# Patient Record
Sex: Female | Born: 1990 | Race: Black or African American | Hispanic: No | Marital: Single | Smoking: Never smoker
Health system: Southern US, Community
[De-identification: ages and names within clinical notes are randomized; demographics above are authoritative.]

## PROBLEM LIST (undated history)

## (undated) HISTORY — PX: LEG SURGERY: SHX1003

---

## 2010-08-25 ENCOUNTER — Emergency Department (HOSPITAL_BASED_OUTPATIENT_CLINIC_OR_DEPARTMENT_OTHER)
Admission: EM | Admit: 2010-08-25 | Discharge: 2010-08-25 | Disposition: A | Discharge status: Home / Self Care | Payer: No Typology Code available for payment source | Attending: Emergency Medicine | Admitting: Emergency Medicine

## 2010-08-25 ENCOUNTER — Emergency Department (INDEPENDENT_AMBULATORY_CARE_PROVIDER_SITE_OTHER): Payer: No Typology Code available for payment source

## 2010-08-25 DIAGNOSIS — R071 Chest pain on breathing: Secondary | ICD-10-CM | POA: Insufficient documentation

## 2010-08-25 DIAGNOSIS — M549 Dorsalgia, unspecified: Secondary | ICD-10-CM | POA: Insufficient documentation

## 2010-08-25 DIAGNOSIS — Y92410 Unspecified street and highway as the place of occurrence of the external cause: Secondary | ICD-10-CM | POA: Insufficient documentation

## 2010-08-25 DIAGNOSIS — R0789 Other chest pain: Secondary | ICD-10-CM

## 2010-08-25 DIAGNOSIS — M545 Low back pain: Secondary | ICD-10-CM

## 2010-08-25 DIAGNOSIS — R51 Headache: Secondary | ICD-10-CM | POA: Insufficient documentation

## 2011-04-20 ENCOUNTER — Encounter (HOSPITAL_BASED_OUTPATIENT_CLINIC_OR_DEPARTMENT_OTHER): Payer: Self-pay | Admitting: *Deleted

## 2011-04-20 ENCOUNTER — Emergency Department (HOSPITAL_BASED_OUTPATIENT_CLINIC_OR_DEPARTMENT_OTHER)
Admission: EM | Admit: 2011-04-20 | Discharge: 2011-04-21 | Disposition: A | Discharge status: Home / Self Care | Payer: Self-pay | Attending: Emergency Medicine | Admitting: Emergency Medicine

## 2011-04-20 DIAGNOSIS — R220 Localized swelling, mass and lump, head: Secondary | ICD-10-CM | POA: Insufficient documentation

## 2011-04-20 DIAGNOSIS — J029 Acute pharyngitis, unspecified: Secondary | ICD-10-CM | POA: Insufficient documentation

## 2011-04-20 DIAGNOSIS — R221 Localized swelling, mass and lump, neck: Secondary | ICD-10-CM | POA: Insufficient documentation

## 2011-04-20 MED ORDER — ACETAMINOPHEN-CODEINE 120-12 MG/5ML PO SUSP: 5.0000 mL | Freq: Four times a day (QID) | ORAL | Status: AC | PRN

## 2011-04-20 MED ORDER — IBUPROFEN 800 MG PO TABS
800.0000 mg | ORAL_TABLET | Freq: Once | ORAL | Status: AC
  Administered 2011-04-21: 800 mg via ORAL
  Filled 2011-04-20: qty 1

## 2011-04-20 MED ORDER — ACETAMINOPHEN 325 MG PO TABS
650.0000 mg | ORAL_TABLET | Freq: Once | ORAL | Status: AC
  Administered 2011-04-21: 650 mg via ORAL
  Filled 2011-04-20: qty 2

## 2011-04-20 MED ORDER — DEXAMETHASONE SODIUM PHOSPHATE 10 MG/ML IJ SOLN: 10.0000 mg | Freq: Once | INTRAMUSCULAR | Status: DC

## 2011-04-20 NOTE — ED Notes (Signed)
Pt states that she has had swelling to the left side of her face for 3 days. Feels like it is draining down into her throat.

## 2011-04-20 NOTE — ED Notes (Signed)
MD at bedside. 

## 2011-04-20 NOTE — ED Provider Notes (Signed)
History   This chart was scribed for Cassie Nielsen, MD by Melba Coon. The patient was seen in room MH10/MH10 and the patient's care was started at 11:20PM.    CSN: [id number]  Arrival date & time 04/20/11  2204   None     Chief Complaint  Patient presents with  . Facial Swelling    (Consider location/radiation/quality/duration/timing/severity/associated sxs/prior treatment) HPI Cassie Smith is a 21 y.o. female who presents to the Emergency Department complaining of left-sided lower facial/neck pain with an onset 3 days ago. Associated swelling with left side of face and neck. Pt states she can't swallow. Pt sates that wisdom teeth are growing in. Slight cough present. Pt states that she has had intermittant episodes of abd pain and describe it as sharp paint that takes her breath away, but she hasn't had them in a while- not in the last 3 days. No n/v/d, fever, rhinorrhea, or congestion. Pt has had her flu shot.   No PCP  No rash. No tick exposure. Some left ear pain. No dental pain and no difficulty opening her mouth. No known sick contacts. She is a Archivist.  History reviewed. No pertinent past medical history.  Past Surgical History  Procedure Date  . Leg surgery     History reviewed. No pertinent family history.  History  Substance Use Topics  . Smoking status: Never Smoker   . Smokeless tobacco: Not on file  . Alcohol Use: No    OB History    Grav Para Term Preterm Abortions TAB SAB Ect Mult Living                  Review of Systems  Constitutional: Negative for fever and chills.  HENT: Positive for congestion and trouble swallowing. Negative for drooling, mouth sores, neck pain, neck stiffness, dental problem and voice change.   Eyes: Negative for pain.  Respiratory: Negative for shortness of breath.   Cardiovascular: Negative for chest pain.  Gastrointestinal: Negative for abdominal pain.  Genitourinary: Negative for dysuria.  Musculoskeletal:  Negative for back pain.  Skin: Negative for rash.  Neurological: Negative for headaches.  All other systems reviewed and are negative.   10 Systems reviewed and are negative for acute change except as noted in the HPI.  Allergies  Review of patient's allergies indicates no known allergies.  Home Medications   Current Outpatient Rx  Name Route Sig Dispense Refill  . ACETAMINOPHEN 500 MG PO TABS Oral Take 1,000 mg by mouth once as needed. For pain    . ETONOGESTREL-ETHINYL ESTRADIOL 0.12-0.015 MG/24HR VA RING Vaginal Place 1 each vaginally every 28 (twenty-eight) days. Insert vaginally and leave in place for 3 consecutive weeks, then remove for 1 week.      BP 128/79  Pulse 104  Temp(Src) 98.5 F (36.9 C) (Oral)  Resp 18  Ht 5\' 2"  (1.575 m)  Wt 159 lb (72.122 kg)  BMI 29.08 kg/m2  SpO2 100%  LMP 04/14/2011  Physical Exam  Nursing note and vitals reviewed. Constitutional: She is oriented to person, place, and time. She appears well-developed and well-nourished.  HENT:  Head: Normocephalic and atraumatic.  Right Ear: External ear normal.  Left Ear: External ear normal.  Mouth/Throat: Uvula is midline.       No dental tenderness. No trismus. Uvula midline. No posterior pharynx erythema or exudates.  Eyes: Conjunctivae and EOM are normal. Pupils are equal, round, and reactive to light. No scleral icterus.  Neck: Normal range of motion.  Neck supple. No thyromegaly present.  Cardiovascular: Normal rate, regular rhythm and normal heart sounds.  Exam reveals no gallop and no friction rub.   No murmur heard. Pulmonary/Chest: Effort normal and breath sounds normal. No stridor. She has no wheezes. She has no rales. She exhibits no tenderness.  Abdominal: Soft. Bowel sounds are normal. She exhibits no distension. There is no tenderness. There is no rebound.  Musculoskeletal: Normal range of motion. She exhibits no edema.  Lymphadenopathy:    She has cervical adenopathy (Left sided  anterior cervical tenderness).  Neurological: She is alert and oriented to person, place, and time. Coordination normal.  Skin: Skin is warm. No rash noted. No erythema.  Psychiatric: She has a normal mood and affect. Her behavior is normal.    ED Course  Procedures (including critical care time)  DIAGNOSTIC STUDIES: Oxygen Saturation is 100% on room air, normal by my interpretation.    Intramuscular Decadron provided  Tolerates fluids without distress    MDM  Pharyngitis likely viral, no exudates or swelling or erythema on exam. She does have tender anterior cervical lymphadenopathy with associated congestion. No risk factors for RPA. No clinical PTA. No clinical epiglottitis. Plan outpatient treatment with Tylenol Motrin and encourage lots of fluids. Primary care referral provided as needed. Reliable historian and agrees to strict return precautions for any worsening condition.    I personally performed the services described in this documentation, which was scribed in my presence. The recorded information has been reviewed and considered.         Cassie Nielsen, MD 04/20/11 2350

## 2011-04-21 MED ORDER — DEXAMETHASONE 4 MG PO TABS
10.0000 mg | ORAL_TABLET | Freq: Once | ORAL | Status: AC
  Administered 2011-04-21: 10 mg via ORAL

## 2011-04-21 MED ORDER — DEXAMETHASONE 4 MG PO TABS
ORAL_TABLET | ORAL | Status: AC
  Administered 2011-04-21: 10 mg via ORAL
  Filled 2011-04-21: qty 3

## 2011-04-27 ENCOUNTER — Ambulatory Visit (INDEPENDENT_AMBULATORY_CARE_PROVIDER_SITE_OTHER): Payer: PRIVATE HEALTH INSURANCE | Admitting: Family Medicine

## 2011-04-27 VITALS — BP 104/69 | HR 71 | Temp 98.0°F | Resp 16

## 2011-04-27 DIAGNOSIS — J029 Acute pharyngitis, unspecified: Secondary | ICD-10-CM

## 2011-04-27 DIAGNOSIS — R599 Enlarged lymph nodes, unspecified: Secondary | ICD-10-CM

## 2011-04-27 DIAGNOSIS — J019 Acute sinusitis, unspecified: Secondary | ICD-10-CM

## 2011-04-27 DIAGNOSIS — R591 Generalized enlarged lymph nodes: Secondary | ICD-10-CM

## 2011-04-27 MED ORDER — PREDNISONE 20 MG PO TABS: 40.0000 mg | ORAL_TABLET | Freq: Every day | ORAL | Status: AC

## 2011-04-27 MED ORDER — AMOXICILLIN 875 MG PO TABS: 875.0000 mg | ORAL_TABLET | Freq: Two times a day (BID) | ORAL | Status: AC

## 2011-04-27 NOTE — Progress Notes (Signed)
  Subjective:    Patient ID: Cassie Smith, female    DOB: 1991-03-08, 21 y.o.   MRN: [id number]  HPI 21 yo female with c/o sore throat, knot under chin for 10 days.  Seen in ED 1/27, treated with tylenol and dexamethasone and sent home with tylenol #3.  Note reviewed.  No strep test. Improved briefly.  Returned 3 days ago and worse.  Hurts to swallow.  No fever.  Had cold/URI symptoms intially but now just the ST, the lump, and PND.  Still has tonsils.   No cough.   Never had mono.  Does occasionally get strep throat.   Left ear pain/pressure.  Hearing normal.    Review of Systems Negative except as per HPI    Objective:   Physical Exam  Constitutional: She appears well-developed. No distress.  HENT:  Right Ear: Tympanic membrane, external ear and ear canal normal. Tympanic membrane is not injected, not scarred, not perforated, not erythematous, not retracted and not bulging.  Left Ear: External ear and ear canal normal. Tympanic membrane is not injected, not scarred, not perforated, not erythematous, not retracted and not bulging. A middle ear effusion is present.  Nose: Mucosal edema and sinus tenderness present. No rhinorrhea. Right sinus exhibits no maxillary sinus tenderness and no frontal sinus tenderness. Left sinus exhibits maxillary sinus tenderness. Left sinus exhibits no frontal sinus tenderness.  Mouth/Throat: Uvula is midline, oropharynx is clear and moist and mucous membranes are normal. No oropharyngeal exudate or tonsillar abscesses.  Cardiovascular: Normal rate, regular rhythm, normal heart sounds and intact distal pulses.   No murmur heard. Pulmonary/Chest: Effort normal and breath sounds normal. No respiratory distress. She has no wheezes. She has no rales.  Lymphadenopathy:       Head (right side): No submandibular and no preauricular adenopathy present.       Head (left side): No submandibular and no preauricular adenopathy present.    She has cervical adenopathy.       Right cervical: No superficial cervical and no posterior cervical adenopathy present.      Left cervical: Superficial cervical adenopathy present. No posterior cervical adenopathy present.       Right: No supraclavicular adenopathy present.       Left: No supraclavicular adenopathy present.  Skin: Skin is warm and dry.          Assessment & Plan:  Sore throat Lymphadeopathy Sinusitis  Amox and pred, see RX

## 2011-07-29 ENCOUNTER — Emergency Department (HOSPITAL_BASED_OUTPATIENT_CLINIC_OR_DEPARTMENT_OTHER)
Admission: EM | Admit: 2011-07-29 | Discharge: 2011-07-29 | Disposition: A | Discharge status: Home / Self Care | Payer: PRIVATE HEALTH INSURANCE | Attending: Emergency Medicine | Admitting: Emergency Medicine

## 2011-07-29 ENCOUNTER — Encounter (HOSPITAL_BASED_OUTPATIENT_CLINIC_OR_DEPARTMENT_OTHER): Payer: Self-pay | Admitting: *Deleted

## 2011-07-29 ENCOUNTER — Encounter (HOSPITAL_COMMUNITY): Payer: Self-pay | Admitting: *Deleted

## 2011-07-29 ENCOUNTER — Emergency Department (INDEPENDENT_AMBULATORY_CARE_PROVIDER_SITE_OTHER): Payer: PRIVATE HEALTH INSURANCE

## 2011-07-29 ENCOUNTER — Emergency Department (HOSPITAL_COMMUNITY)
Admission: EM | Admit: 2011-07-29 | Discharge: 2011-07-29 | Disposition: A | Discharge status: Home / Self Care | Payer: PRIVATE HEALTH INSURANCE | Attending: Emergency Medicine | Admitting: Emergency Medicine

## 2011-07-29 DIAGNOSIS — R10819 Abdominal tenderness, unspecified site: Secondary | ICD-10-CM | POA: Insufficient documentation

## 2011-07-29 DIAGNOSIS — N200 Calculus of kidney: Secondary | ICD-10-CM

## 2011-07-29 DIAGNOSIS — R109 Unspecified abdominal pain: Secondary | ICD-10-CM | POA: Insufficient documentation

## 2011-07-29 DIAGNOSIS — R110 Nausea: Secondary | ICD-10-CM | POA: Insufficient documentation

## 2011-07-29 DIAGNOSIS — N949 Unspecified condition associated with female genital organs and menstrual cycle: Secondary | ICD-10-CM

## 2011-07-29 DIAGNOSIS — R112 Nausea with vomiting, unspecified: Secondary | ICD-10-CM | POA: Insufficient documentation

## 2011-07-29 DIAGNOSIS — R319 Hematuria, unspecified: Secondary | ICD-10-CM

## 2011-07-29 LAB — URINALYSIS, ROUTINE W REFLEX MICROSCOPIC
Glucose, UA: NEGATIVE mg/dL
Ketones, ur: >80 mg/dL — AB
Protein, ur: NEGATIVE mg/dL

## 2011-07-29 LAB — URINE MICROSCOPIC-ADD ON

## 2011-07-29 MED ORDER — HYDROCODONE-ACETAMINOPHEN 5-325 MG PO TABS
1.0000 | ORAL_TABLET | Freq: Once | ORAL | Status: AC
  Administered 2011-07-29: 1 via ORAL
  Filled 2011-07-29: qty 1

## 2011-07-29 MED ORDER — HYDROCODONE-ACETAMINOPHEN 5-500 MG PO TABS: 1.0000 | ORAL_TABLET | Freq: Four times a day (QID) | ORAL | Status: AC | PRN

## 2011-07-29 MED ORDER — KETOROLAC TROMETHAMINE 60 MG/2ML IM SOLN
60.0000 mg | Freq: Once | INTRAMUSCULAR | Status: AC
  Administered 2011-07-29: 60 mg via INTRAMUSCULAR
  Filled 2011-07-29: qty 2

## 2011-07-29 NOTE — ED Notes (Signed)
Pt in c/o abd pain with n/v, stating her period started today and pain started then, pt hyperventilating in triage, pt states she could not have been pregnant

## 2011-07-29 NOTE — ED Notes (Signed)
MD at bedside giving test results and plan of care. 

## 2011-07-29 NOTE — ED Notes (Signed)
Pt c/o sudden onset severe suprapubic pains that began at [facility]. States she is currently on her menstrual cycle, but that these cramps "are the worse of my life". Pt denies urinary or vaginal complaints. N/V. Denies change in bowel.

## 2011-07-29 NOTE — Discharge Instructions (Signed)

## 2011-07-29 NOTE — ED Provider Notes (Signed)
History     CSN: [id number]  Arrival date & time 07/29/11  2107   First MD Initiated Contact with Patient 07/29/11 2149      Chief Complaint  Patient presents with  . Abdominal Pain    (Consider location/radiation/quality/duration/timing/severity/associated sxs/prior treatment) HPI Comments: Patient states "severe cramps".  She has a history of these with periods, but nothing like this.    Patient is a 21 y.o. female presenting with abdominal pain. The history is provided by the patient.  Abdominal Pain The primary symptoms of the illness include abdominal pain and nausea. The primary symptoms of the illness do not include fever, vomiting, diarrhea, dysuria, vaginal discharge or vaginal bleeding. Episode onset: this morning. The onset of the illness was sudden. The problem has been rapidly worsening.  The patient states that she believes she is currently not pregnant. The patient has not had a change in bowel habit.    History reviewed. No pertinent past medical history.  Past Surgical History  Procedure Date  . Leg surgery     No family history on file.  History  Substance Use Topics  . Smoking status: Never Smoker   . Smokeless tobacco: Not on file  . Alcohol Use: No    OB History    Grav Para Term Preterm Abortions TAB SAB Ect Mult Living                  Review of Systems  Constitutional: Negative for fever.  Gastrointestinal: Positive for nausea and abdominal pain. Negative for vomiting and diarrhea.  Genitourinary: Negative for dysuria, vaginal bleeding and vaginal discharge.  All other systems reviewed and are negative.    Allergies  Review of patient's allergies indicates no known allergies.  Home Medications   Current Outpatient Rx  Name Route Sig Dispense Refill  . ACETAMINOPHEN 500 MG PO TABS Oral Take 1,000 mg by mouth once as needed. For pain    . ETONOGESTREL-ETHINYL ESTRADIOL 0.12-0.015 MG/24HR VA RING Vaginal Place 1 each vaginally every 28  (twenty-eight) days. Insert vaginally and leave in place for 3 consecutive weeks, then remove for 1 week.      BP 114/75  Pulse 81  Temp(Src) 98.6 F (37 C) (Oral)  Resp 18  Ht 5\' 3"  (1.6 m)  Wt 160 lb (72.576 kg)  BMI 28.34 kg/m2  SpO2 100%  LMP 07/29/2011  Physical Exam  Nursing note and vitals reviewed. Constitutional: She is oriented to person, place, and time. She appears well-developed and well-nourished. No distress.  HENT:  Head: Normocephalic and atraumatic.  Neck: Normal range of motion. Neck supple.  Cardiovascular: Normal rate and regular rhythm.  Exam reveals no gallop and no friction rub.   No murmur heard. Pulmonary/Chest: Effort normal and breath sounds normal. No respiratory distress. She has no wheezes.  Abdominal: Soft. Bowel sounds are normal. She exhibits no distension.       TTP suprapubic region.  No rebound or guarding.  Musculoskeletal: Normal range of motion.  Neurological: She is alert and oriented to person, place, and time.  Skin: Skin is warm and dry. She is not diaphoretic.    ED Course  Procedures (including critical care time)  Labs Reviewed  URINALYSIS, ROUTINE W REFLEX MICROSCOPIC - Abnormal; Notable for the following:    APPearance CLOUDY (*)    Hgb urine dipstick LARGE (*)    Ketones, ur >80 (*)    All other components within normal limits  PREGNANCY, URINE  URINE MICROSCOPIC-ADD ON  No results found.   No diagnosis found.    MDM  The ct looks okay.  There is no evidence of ovarian cyst or renal calculus.  She will be sent home with pain meds, to return prn if worsens.        Geoffery Lyons, MD 07/29/11 2303

## 2011-07-29 NOTE — ED Notes (Signed)
Pt standing outside of triage room asking how much longer her wait will be, explained to patient that rooms are full in the back and that we have kept her in this room so she can be screened if the MD has time, explained that she cannot receive pain medication until seen by MD. Pt tried to close door on this RN and told me to leave room. I again explained to patient that I was doing what I could to get her seen but if she was going to be aggressive she could return to lobby, pt told this RN to leave room, again explained that I am doing what I can to get patient seen faster. Pt and family walked out of room and walked out of ED.

## 2013-06-16 ENCOUNTER — Encounter (HOSPITAL_COMMUNITY): Payer: Self-pay | Admitting: Emergency Medicine

## 2013-06-16 ENCOUNTER — Emergency Department (HOSPITAL_COMMUNITY)
Admission: EM | Admit: 2013-06-16 | Discharge: 2013-06-16 | Disposition: A | Discharge status: Home / Self Care | Payer: No Typology Code available for payment source | Attending: Emergency Medicine | Admitting: Emergency Medicine

## 2013-06-16 DIAGNOSIS — G510 Bell's palsy: Secondary | ICD-10-CM | POA: Insufficient documentation

## 2013-06-16 MED ORDER — ACYCLOVIR 400 MG PO TABS: 400.0000 mg | ORAL_TABLET | Freq: Every day | ORAL | Status: DC

## 2013-06-16 MED ORDER — PREDNISONE 20 MG PO TABS: 60.0000 mg | ORAL_TABLET | Freq: Every day | ORAL | Status: DC

## 2013-06-16 NOTE — ED Provider Notes (Signed)
I saw and evaluated the patient, reviewed the resident's note and I agree with the findings and plan.   EKG Interpretation None      Pt is a 23 y.o. F with no significant past medical history presents emergency department with one day of mild right-sided facial droop and numbness in her right eye. She states her eyes and watering and feels that she is drooling when she eats. No history of head injury, anticoagulation use. No headache. No other numbness or weakness. No fever. On exam, patient has very mild right-sided facial droop with no sensory deficit. There is for head involvement. She is able to close her eyes but not as tightly as the left side. Her other cranial nerves are intact and she is otherwise neurologically intact. Her TMs are clear bilaterally. There are no vesicular lesions. No meningismus. Patient likely has Bell's palsy. We'll discharge home with prednisone and acyclovir. Have given return precautions and supportive care instructions. Will also sent home with eye lubricant. Patient verbalizes understanding and is comfortable with this plan.  Layla MawKristen N Cariana Karge, DO 06/16/13 1621

## 2013-06-16 NOTE — ED Notes (Signed)
Pt states the right sided facial numbness started yesterday.  Pt states "it feels like I have been injected with novacain like going to the dentist".  Pt also reports right sided tongue numbness and light headache.

## 2013-06-16 NOTE — Discharge Instructions (Signed)
Bell's Palsy  Bell's palsy is a condition in which the muscles on one side of the face cannot move (paralysis). This is because the nerves in the face are paralyzed. It is most often thought to be caused by a virus. The virus causes swelling of the nerve that controls movement on one side of the face. The nerve travels through a tight space surrounded by bone. When the nerve swells, it can be compressed by the bone. This results in damage to the protective covering around the nerve. This damage interferes with how the nerve communicates with the muscles of the face. As a result, it can cause weakness or paralysis of the facial muscles.   Injury (trauma), tumor, and surgery may cause Bell's palsy, but most of the time the cause is unknown. It is a relatively common condition. It starts suddenly (abrupt onset) with the paralysis usually ending within 2 days. Bell's palsy is not dangerous. But because the eye does not close properly, you may need care to keep the eye from getting dry. This can include splinting (to keep the eye shut) or moistening with artificial tears. Bell's palsy very seldom occurs on both sides of the face at the same time.  SYMPTOMS    Eyebrow sagging.   Drooping of the eyelid and corner of the mouth.   Inability to close one eye.   Loss of taste on the front of the tongue.   Sensitivity to loud noises.  TREATMENT   The treatment is usually non-surgical. If the patient is seen within the first 24 to 48 hours, a short course of steroids may be prescribed, in an attempt to shorten the length of the condition. Antiviral medicines may also be used with the steroids, but it is unclear if they are helpful.   You will need to protect your eye, if you cannot close it. The cornea (clear covering over your eye) will become dry and can be damaged. Artificial tears can be used to keep your eye moist. Glasses or an eye patch should be worn to protect your eye.  PROGNOSIS   Recovery is variable, ranging  from days to months. Although the problem usually goes away completely (about 80% of cases resolve), predicting the outcome is impossible. Most people improve within 3 weeks of when the symptoms began. Improvement may continue for 3 to 6 months. A small number of people have moderate to severe weakness that is permanent.   HOME CARE INSTRUCTIONS    If your caregiver prescribed medication to reduce swelling in the nerve, use as directed. Do not stop taking the medication unless directed by your caregiver.   Use moisturizing eye drops as needed to prevent drying of your eye, as directed by your caregiver.   Protect your eye, as directed by your caregiver.   Use facial massage and exercises, as directed by your caregiver.   Perform your normal activities, and get your normal rest.  SEEK IMMEDIATE MEDICAL CARE IF:    There is pain, redness or irritation in the eye.   You or your child has an oral temperature above 102 F (38.9 C), not controlled by medicine.  MAKE SURE YOU:    Understand these instructions.   Will watch your condition.   Will get help right away if you are not doing well or get worse.  Document Released: 03/10/2005 Document Revised: 06/02/2011 Document Reviewed: 03/19/2009  [employer] Patient Information 2014 [employer].

## 2013-06-16 NOTE — ED Notes (Signed)
Dr. Ward at bedside.

## 2013-06-16 NOTE — ED Provider Notes (Signed)
CSN: [id number]     Arrival date & time 06/16/13  1232 History   First MD Initiated Contact with Patient 06/16/13 1246     Chief Complaint  Patient presents with  . Numbness     (Consider location/radiation/quality/duration/timing/severity/associated sxs/prior Treatment) HPI Comments: 1 day of R facial droop, tongue and face numbness. Pt states started yesterday. States that her eye is watery and tears keep falling out of it. Mild blurriness at times, but not when she states she blinks and keeps it lubricated. No hx of prior. No arm/leg weakness or numbness. No problems walking. No recent illness. No problems with hearing or taste. When asked to further clarify the numbness she states it "feels different" but does acknowledge she has sensation.   Patient is a 23 y.o. female presenting with neurologic complaint. The history is provided by the patient.  Neurologic Problem This is a new problem. The current episode started yesterday. The problem occurs constantly. The problem has been unchanged. Associated symptoms include numbness (R tongue and facial numbness) and weakness (R facial droop). Pertinent negatives include no abdominal pain, chest pain, congestion, coughing, fever, headaches or vomiting. Nothing aggravates the symptoms. She has tried nothing for the symptoms. The treatment provided no relief.    History reviewed. No pertinent past medical history. Past Surgical History  Procedure Laterality Date  . Leg surgery     History reviewed. No pertinent family history. History  Substance Use Topics  . Smoking status: Never Smoker   . Smokeless tobacco: Not on file  . Alcohol Use: No   OB History   Grav Para Term Preterm Abortions TAB SAB Ect Mult Living                 Review of Systems  Constitutional: Negative for fever, activity change and appetite change.  HENT: Negative for congestion and rhinorrhea.   Eyes: Negative for discharge, redness and itching.  Respiratory:  Negative for cough, shortness of breath and wheezing.   Cardiovascular: Negative for chest pain and palpitations.  Gastrointestinal: Negative for vomiting and abdominal pain.  Genitourinary: Negative for decreased urine volume and difficulty urinating.  Neurological: Positive for weakness (R facial droop) and numbness (R tongue and facial numbness). Negative for headaches.      Allergies  Review of patient's allergies indicates no known allergies.  Home Medications   Current Outpatient Rx  Name  Route  Sig  Dispense  Refill  . acyclovir (ZOVIRAX) 400 MG tablet   Oral   Take 1 tablet (400 mg total) by mouth 5 (five) times daily.   35 tablet   0   . predniSONE (DELTASONE) 20 MG tablet   Oral   Take 3 tablets (60 mg total) by mouth daily.   21 tablet   0    BP 105/73  Pulse 62  Temp(Src) 97.9 F (36.6 C) (Oral)  Resp 17  Ht 5\' 2"  (1.575 m)  Wt 150 lb (68.04 kg)  BMI 27.43 kg/m2  SpO2 100%  LMP 06/09/2013 Physical Exam  Constitutional: She is oriented to person, place, and time. She appears well-developed and well-nourished. No distress.  HENT:  Head: Normocephalic and atraumatic.  Mouth/Throat: Oropharynx is clear and moist.  Eyes: Conjunctivae and EOM are normal. Pupils are equal, round, and reactive to light. Right eye exhibits no discharge. Left eye exhibits no discharge. No scleral icterus.  Neck: Normal range of motion. Neck supple.  Cardiovascular: Normal rate, regular rhythm and intact distal pulses.  Exam reveals  no gallop and no friction rub.   No murmur heard. Pulmonary/Chest: Effort normal and breath sounds normal. No respiratory distress. She has no wheezes. She has no rales.  Abdominal: Soft. She exhibits no distension and no mass. There is no tenderness.  Musculoskeletal: Normal range of motion.  Neurological: She is alert and oriented to person, place, and time. A cranial nerve deficit (CN VII palsy, forehead involvement as well) is present. She exhibits  normal muscle tone. Coordination normal.  5/5 strength in all exts, normal sensation in all exts, 2+ DTRs in patella and brachioradilias, F2N negative.  CN VII palsy, no other CN palsy Equal symmetric sensation of face  Skin: She is not diaphoretic.    ED Course  Procedures (including critical care time) Labs Review Labs Reviewed - No data to display Imaging Review No results found.   EKG Interpretation None      MDM   MDM: 23 AAF w/ cc: of 1 day of tongue, facial numbness and face droop. No other sxs. AFVSS, well appearing. Bells palsy present on exam as has forehead involvement. No objective sensation deficit. No other deficit. Will treat with Valtrex and Prednisone. Artifical tears for eye involvement. Discharged.  Final diagnoses:  Bell's palsy    Discharged  Pilar Jarvis, MD 06/16/13 1343

## 2013-06-16 NOTE — ED Notes (Signed)
Pt reports right side facial and tongue numbness/tingling that started yesterday. Reports mild blurred vision to right eye and her eye is watery but she is able to close it. Mild facial droop noted.

## 2013-12-29 ENCOUNTER — Emergency Department (HOSPITAL_BASED_OUTPATIENT_CLINIC_OR_DEPARTMENT_OTHER)
Admission: EM | Admit: 2013-12-29 | Discharge: 2013-12-30 | Disposition: A | Discharge status: Home / Self Care | Payer: No Typology Code available for payment source | Attending: Emergency Medicine | Admitting: Emergency Medicine

## 2013-12-29 ENCOUNTER — Encounter (HOSPITAL_BASED_OUTPATIENT_CLINIC_OR_DEPARTMENT_OTHER): Payer: Self-pay | Admitting: Emergency Medicine

## 2013-12-29 DIAGNOSIS — N941 Dyspareunia: Secondary | ICD-10-CM | POA: Insufficient documentation

## 2013-12-29 DIAGNOSIS — Z79899 Other long term (current) drug therapy: Secondary | ICD-10-CM | POA: Diagnosis not present

## 2013-12-29 DIAGNOSIS — N946 Dysmenorrhea, unspecified: Secondary | ICD-10-CM | POA: Insufficient documentation

## 2013-12-29 DIAGNOSIS — Z7952 Long term (current) use of systemic steroids: Secondary | ICD-10-CM | POA: Diagnosis not present

## 2013-12-29 DIAGNOSIS — R109 Unspecified abdominal pain: Secondary | ICD-10-CM | POA: Diagnosis present

## 2013-12-29 DIAGNOSIS — Z3202 Encounter for pregnancy test, result negative: Secondary | ICD-10-CM | POA: Diagnosis not present

## 2013-12-29 DIAGNOSIS — IMO0002 Reserved for concepts with insufficient information to code with codable children: Secondary | ICD-10-CM

## 2013-12-29 LAB — BASIC METABOLIC PANEL
ANION GAP: 16 — AB (ref 5–15)
BUN: 8 mg/dL (ref 6–23)
CHLORIDE: 100 meq/L (ref 96–112)
CO2: 23 mEq/L (ref 19–32)
Calcium: 9.5 mg/dL (ref 8.4–10.5)
Creatinine, Ser: 0.5 mg/dL (ref 0.50–1.10)
GFR calc non Af Amer: >90 mL/min (ref >90)
Glucose, Bld: 109 mg/dL — ABNORMAL HIGH (ref 70–99)
POTASSIUM: 3.9 meq/L (ref 3.7–5.3)
Sodium: 139 mEq/L (ref 137–147)

## 2013-12-29 LAB — CBC WITH DIFFERENTIAL/PLATELET
BASOS ABS: 0 10*3/uL (ref 0.0–0.1)
BASOS PCT: 0 % (ref 0–1)
Eosinophils Absolute: 0 10*3/uL (ref 0.0–0.7)
Eosinophils Relative: 0 % (ref 0–5)
HCT: 32.6 % — ABNORMAL LOW (ref 36.0–46.0)
HEMOGLOBIN: 10.5 g/dL — AB (ref 12.0–15.0)
LYMPHS PCT: 7 % — AB (ref 12–46)
Lymphs Abs: 0.8 10*3/uL (ref 0.7–4.0)
MCH: 23.4 pg — ABNORMAL LOW (ref 26.0–34.0)
MCHC: 32.2 g/dL (ref 30.0–36.0)
MCV: 72.6 fL — ABNORMAL LOW (ref 78.0–100.0)
MONOS PCT: 3 % (ref 3–12)
Monocytes Absolute: 0.4 10*3/uL (ref 0.1–1.0)
NEUTROS ABS: 10.2 10*3/uL — AB (ref 1.7–7.7)
NEUTROS PCT: 90 % — AB (ref 43–77)
Platelets: 310 10*3/uL (ref 150–400)
RBC: 4.49 MIL/uL (ref 3.87–5.11)
RDW: 16.9 % — AB (ref 11.5–15.5)
WBC: 11.3 10*3/uL — AB (ref 4.0–10.5)

## 2013-12-29 LAB — URINALYSIS, ROUTINE W REFLEX MICROSCOPIC
BILIRUBIN URINE: NEGATIVE
GLUCOSE, UA: NEGATIVE mg/dL
Ketones, ur: >80 mg/dL — AB
Leukocytes, UA: NEGATIVE
Nitrite: NEGATIVE
PROTEIN: NEGATIVE mg/dL
Specific Gravity, Urine: 1.024 (ref 1.005–1.030)
Urobilinogen, UA: 1 mg/dL (ref 0.0–1.0)
pH: 6 (ref 5.0–8.0)

## 2013-12-29 LAB — WET PREP, GENITAL
Clue Cells Wet Prep HPF POC: NONE SEEN
TRICH WET PREP: NONE SEEN
YEAST WET PREP: NONE SEEN

## 2013-12-29 LAB — URINE MICROSCOPIC-ADD ON

## 2013-12-29 LAB — PREGNANCY, URINE: PREG TEST UR: NEGATIVE

## 2013-12-29 MED ORDER — CEFTRIAXONE SODIUM 250 MG IJ SOLR
250.0000 mg | Freq: Once | INTRAMUSCULAR | Status: AC
  Administered 2013-12-30: 250 mg via INTRAMUSCULAR
  Filled 2013-12-29: qty 250

## 2013-12-29 MED ORDER — ONDANSETRON HCL 4 MG/2ML IJ SOLN
4.0000 mg | Freq: Once | INTRAMUSCULAR | Status: AC
  Administered 2013-12-29: 4 mg via INTRAVENOUS
  Filled 2013-12-29: qty 2

## 2013-12-29 MED ORDER — KETOROLAC TROMETHAMINE 30 MG/ML IJ SOLN
30.0000 mg | Freq: Once | INTRAMUSCULAR | Status: AC
  Administered 2013-12-29: 30 mg via INTRAVENOUS
  Filled 2013-12-29: qty 1

## 2013-12-29 MED ORDER — SODIUM CHLORIDE 0.9 % IV BOLUS (SEPSIS)
500.0000 mL | Freq: Once | INTRAVENOUS | Status: AC
  Administered 2013-12-29: 500 mL via INTRAVENOUS

## 2013-12-29 MED ORDER — SODIUM CHLORIDE 0.9 % IV SOLN
Freq: Once | INTRAVENOUS | Status: AC
  Administered 2013-12-29: 1000 mL via INTRAVENOUS

## 2013-12-29 NOTE — ED Notes (Addendum)
Pt reports abd cramps x 2 hours; vomited x 3; reports period started at same time. Pt states "i can't sit here for 2 hours". Explained to pt that triage nurse would see her shortly. Pt ambulatory with steady gait- Observed walking out of ED doors after talking to nursefirst RN.

## 2013-12-29 NOTE — ED Notes (Signed)
Called x 1 for triage. Pt outside. Has now returned to lobby

## 2013-12-29 NOTE — ED Notes (Signed)
Abdominal pain and cramping x 2 hours. States she started her menses.

## 2013-12-29 NOTE — ED Provider Notes (Signed)
CSN: [id number]     Arrival date & time 12/29/13  [id number] History   This chart was scribed for Cassie Smith, * by Jarvis Morganaylor Saccente, ED Scribe. This patient was seen in room MH11/MH11 and the patient's care was started at 9:48 PM.    Chief Complaint  Patient presents with  . Abdominal Cramping      The history is provided by the patient. No language interpreter was used.   HPI Comments: Cassie Smith is a 23 y.o. female who presents to the Emergency Department complaining of lower abdominal cramping for 2 hours that radiates into her back. Pt states she has had associated episodic vomiting, dyspareunia, and vaginal discharge. She had 3 episodes of vomiting since the symptoms came on. She notes that her menstrual period started today with the symptoms. She has a h/o of painful menstrual cramps but states this is worse than it has been in the past. She does not currently have a gynecologist. Pt notes some spotting throughout the past month. She denies any fever, chills, HA, diarrhea, or dysuria     History reviewed. No pertinent past medical history. Past Surgical History  Procedure Laterality Date  . Leg surgery     No family history on file. History  Substance Use Topics  . Smoking status: Never Smoker   . Smokeless tobacco: Not on file  . Alcohol Use: No   OB History   Grav Para Term Preterm Abortions TAB SAB Ect Mult Living                 Review of Systems  Constitutional: Negative for fever and chills.  Gastrointestinal: Positive for vomiting (3 episodes) and abdominal pain. Negative for diarrhea.  Genitourinary: Positive for vaginal discharge, menstrual problem and dyspareunia. Negative for dysuria.  Neurological: Negative for headaches.      Allergies  Review of patient's allergies indicates no known allergies.  Home Medications   Prior to Admission medications   Medication Sig Start Date End Date Taking? Authorizing Provider  acyclovir (ZOVIRAX) 400 MG  tablet Take 1 tablet (400 mg total) by mouth 5 (five) times daily. 06/16/13   Pilar Jarvisoug Brtalik, MD  predniSONE (DELTASONE) 20 MG tablet Take 3 tablets (60 mg total) by mouth daily. 06/16/13   Pilar Jarvisoug Brtalik, MD   Triage Vitals: BP 145/91  Pulse 95  Temp(Src) 97.7 F (36.5 C) (Oral)  Resp 18  Ht 5\' 2"  (1.575 m)  Wt 150 lb (68.04 kg)  BMI 27.43 kg/m2  SpO2 100%  LMP 12/29/2013  Physical Exam  Constitutional: She is oriented to person, place, and time. She appears well-developed and well-nourished. No distress.  HENT:  Head: Normocephalic and atraumatic.  Right Ear: Hearing normal.  Left Ear: Hearing normal.  Nose: Nose normal.  Mouth/Throat: Oropharynx is clear and moist and mucous membranes are normal.  Eyes: Conjunctivae and EOM are normal. Pupils are equal, round, and reactive to light.  Neck: Normal range of motion. Neck supple.  Cardiovascular: Regular rhythm, S1 normal and S2 normal.  Exam reveals no gallop and no friction rub.   No murmur heard. Pulmonary/Chest: Effort normal and breath sounds normal. No respiratory distress. She exhibits no tenderness.  Abdominal: Soft. Normal appearance and bowel sounds are normal. There is no hepatosplenomegaly. There is no tenderness. There is no rebound, no guarding, no tenderness at McBurney's point and negative Murphy's sign. No hernia.  Musculoskeletal: Normal range of motion.  Neurological: She is alert and oriented to person, place, and time.  She has normal strength. No cranial nerve deficit or sensory deficit. Coordination normal. GCS eye subscore is 4. GCS verbal subscore is 5. GCS motor subscore is 6.  Skin: Skin is warm, dry and intact. No rash noted. No cyanosis.  Psychiatric: She has a normal mood and affect. Her speech is normal and behavior is normal. Thought content normal.    ED Course  Procedures (including critical care time)  DIAGNOSTIC STUDIES: Oxygen Saturation is 100% on RA, normal by my interpretation.    COORDINATION  OF CARE:    Labs Review Labs Reviewed  URINALYSIS, ROUTINE W REFLEX MICROSCOPIC - Abnormal; Notable for the following:    Hgb urine dipstick LARGE (*)    Ketones, ur >80 (*)    All other components within normal limits  URINE MICROSCOPIC-ADD ON - Abnormal; Notable for the following:    Squamous Epithelial / LPF FEW (*)    All other components within normal limits  PREGNANCY, URINE    Imaging Review No results found.   EKG Interpretation None      MDM   Final diagnoses:  None   dysmenorrhea  Dyspareunia   Patient presents to the ER for evaluation of pelvic cramping. Patient reports that she started her menstrual period today and has had significant cramping associated with her period. Patient reports previous symptoms of significant cramping with her menstruation in the past that has been similar to this pain.  Patient also, however, reports vaginal discharge and dyspareunia. Pelvic examination did not show any significant discharge. She had very slight cervical motion tenderness, cultures have been obtained. We'll treat empirically for possibility of pelvic inflammatory disease. Follow up with OB/GYN.  I personally performed the services described in this documentation, which was scribed in my presence. The recorded information has been reviewed and is accurate.     Cassie Crease, MD 12/30/13 [phone]

## 2013-12-30 LAB — RPR

## 2013-12-30 LAB — HIV ANTIBODY (ROUTINE TESTING W REFLEX): HIV 1&2 Ab, 4th Generation: NONREACTIVE

## 2013-12-30 MED ORDER — IBUPROFEN 800 MG PO TABS: 800.0000 mg | ORAL_TABLET | Freq: Three times a day (TID) | ORAL | Status: DC

## 2013-12-30 MED ORDER — HYDROCODONE-ACETAMINOPHEN 5-325 MG PO TABS: 2.0000 | ORAL_TABLET | ORAL | Status: DC | PRN

## 2013-12-30 MED ORDER — LIDOCAINE HCL (PF) 1 % IJ SOLN
INTRAMUSCULAR | Status: AC
  Administered 2013-12-30: 5 mL
  Filled 2013-12-30: qty 5

## 2013-12-30 MED ORDER — DOXYCYCLINE HYCLATE 100 MG PO CAPS: 100.0000 mg | ORAL_CAPSULE | Freq: Two times a day (BID) | ORAL | Status: DC

## 2013-12-30 NOTE — Discharge Instructions (Signed)
Dysmenorrhea °Menstrual cramps (dysmenorrhea) are caused by the muscles of the uterus tightening (contracting) during a menstrual period. For some women, this discomfort is merely bothersome. For others, dysmenorrhea can be severe enough to interfere with everyday activities for a few days each month. °Primary dysmenorrhea is menstrual cramps that last a couple of days when you start having menstrual periods or soon after. This often begins after a teenager starts having her period. As a woman gets older or has a baby, the cramps will usually lessen or disappear. Secondary dysmenorrhea begins later in life, lasts longer, and the pain may be stronger than primary dysmenorrhea. The pain may start before the period and last a few days after the period.  °CAUSES  °Dysmenorrhea is usually caused by an underlying problem, such as: °· The tissue lining the uterus grows outside of the uterus in other areas of the body (endometriosis). °· The endometrial tissue, which normally lines the uterus, is found in or grows into the muscular walls of the uterus (adenomyosis). °· The pelvic blood vessels are engorged with blood just before the menstrual period (pelvic congestive syndrome). °· Overgrowth of cells (polyps) in the lining of the uterus or cervix. °· Falling down of the uterus (prolapse) because of loose or stretched ligaments. °· Depression. °· Bladder problems, infection, or inflammation. °· Problems with the intestine, a tumor, or irritable bowel syndrome. °· Cancer of the female organs or bladder. °· A severely tipped uterus. °· A very tight opening or closed cervix. °· Noncancerous tumors of the uterus (fibroids). °· Pelvic inflammatory disease (PID). °· Pelvic scarring (adhesions) from a previous surgery. °· Ovarian cyst. °· An intrauterine device (IUD) used for birth control. °RISK FACTORS °You may be at greater risk of dysmenorrhea if: °· You are younger than age 30. °· You started puberty early. °· You have  irregular or heavy bleeding. °· You have never given birth. °· You have a family history of this problem. °· You are a smoker. °SIGNS AND SYMPTOMS  °· Cramping or throbbing pain in your lower abdomen. °· Headaches. °· Lower back pain. °· Nausea or vomiting. °· Diarrhea. °· Sweating or dizziness. °· Loose stools. °DIAGNOSIS  °A diagnosis is based on your history, symptoms, physical exam, diagnostic tests, or procedures. Diagnostic tests or procedures may include: °· Blood tests. °· Ultrasonography. °· An examination of the lining of the uterus (dilation and curettage, D&C). °· An examination inside your abdomen or pelvis with a scope (laparoscopy). °· X-rays. °· CT scan. °· MRI. °· An examination inside the bladder with a scope (cystoscopy). °· An examination inside the intestine or stomach with a scope (colonoscopy, gastroscopy). °TREATMENT  °Treatment depends on the cause of the dysmenorrhea. Treatment may include: °· Pain medicine prescribed by your health care provider. °· Birth control pills or an IUD with progesterone hormone in it. °· Hormone replacement therapy. °· Nonsteroidal anti-inflammatory drugs (NSAIDs). These may help stop the production of prostaglandins. °· Surgery to remove adhesions, endometriosis, ovarian cyst, or fibroids. °· Removal of the uterus (hysterectomy). °· Progesterone shots to stop the menstrual period. °· Cutting the nerves on the sacrum that go to the female organs (presacral neurectomy). °· Electric current to the sacral nerves (sacral nerve stimulation). °· Antidepressant medicine. °· Psychiatric therapy, counseling, or group therapy. °· Exercise and physical therapy. °· Meditation and yoga therapy. °· Acupuncture. °HOME CARE INSTRUCTIONS  °· Only take over-the-counter or prescription medicines as directed by your health care provider. °· Place a heating pad   or hot water bottle on your lower back or abdomen. Do not sleep with the heating pad.  Use aerobic exercises, walking,  swimming, biking, and other exercises to help lessen the cramping.  Massage to the lower back or abdomen may help.  Stop smoking.  Avoid alcohol and caffeine. SEEK MEDICAL CARE IF:   Your pain does not get better with medicine.  You have pain with sexual intercourse.  Your pain increases and is not controlled with medicines.  You have abnormal vaginal bleeding with your period.  You develop nausea or vomiting with your period that is not controlled with medicine. SEEK IMMEDIATE MEDICAL CARE IF:  You pass out.  Document Released: 03/10/2005 Document Revised: 11/10/2012 Document Reviewed: 08/26/2012 [facility] LPExitCare Patient Information 2015 [place], [place]. This information is not intended to replace advice given to you by your health care provider. Make sure you discuss any questions you have with your health care provider.  Dyspareunia Dyspareunia is pain during sexual intercourse. It is most common in women, but it also happens in men.  CAUSES  Female The pain from this condition is usually felt when anything is put into the vagina, but any part of the genitals may cause pain during sex. Even sitting or wearing pants can cause pain. Sometimes, a cause cannot be found. Some causes of pain during intercourse are:  Infections of the skin around the vagina.  Vaginal infections, such as a yeast, bacterial, or viral infection.  Vaginismus. This is the inability to have anything put in the vagina even when the woman wants it to happen. There is an automatic muscle contraction and pain. The pain of the muscle contraction can be so severe that intercourse is impossible.  Allergic reaction from spermicides, semen, condoms, scented tampons, soaps, douches, and vaginal sprays.  A fluid-filled sac (cyst) on the Bartholin or Skene glands, located at the opening of the vagina.  Scar tissue in the vagina from a surgically enlarged opening (episiotomy) or tearing after delivering a baby.  Vaginal  dryness. This is more common in menopause. The normal secretions of the vagina are decreased. Changes in estrogen levels and increased difficulty becoming aroused can cause painful sex. Vaginal dryness can also happen when taking birth control pills.  Thinning of the tissue (atrophy) of the vulva and vagina. This makes the area thinner, smaller, unable to stretch to accommodate a penis, and prone to infection and tearing.  Vulvar vestibulitis or vestibulodynia.This is a condition that causes pain involving the area around the entrance to the vagina.The most common cause in young women is birth control pills.Women with low estrogen levels (postmenopausal women) may also experience this.Other causes include allergic reactions, too many nerve endings, skin conditions, and pelvic muscles that cannot relax.  Vulvar dermatoses. This includes skin conditions such as lichen sclerosus and lichen planus.  Lack of foreplay to lubricate the vagina. This can cause vaginal dryness.  Noncancerous tumors (fibroids) in the uterus.  Uterus lining tissue growing outside the uterus (endometriosis).  Pregnancy that starts in the fallopian tube (tubal pregnancy).  Pregnancy or breastfeeding your baby. This can cause vaginal dryness.  A tilting or prolapse of the uterus. Prolapse is when weak and stretched muscles around the uterus allow it to fall into the vagina.  Problems with the ovaries, cysts, or scar tissue. This may be worse with certain sexual positions.  Previous surgeries causing adhesions or scar tissue in the vagina or pelvis.  Bladder and intestinal problems.  Psychological problems (such as depression  or anxiety). This may make pain worse.  Negative attitudes about sex, experiencing rape, sexual assault, and misinformation about sex. These issues are often related to some types of pain.  Previous pelvic infection, causing scar tissue in the pelvis and on the female organs.  Cyst or tumor  on the ovary.  Cancer of the female organs.  Certain medicines.  Medical problems such as diabetes, arthritis, or thyroid disease. Female In men, there are many physical causes of sexual discomfort. Some causes of pain during intercourse are:  Infections of the prostate, bladder, or seminal vesicles. This can cause pain after ejaculation.  An inflamed bladder (interstitial cystitis). This may cause pain from ejaculation.  Gonorrheal infections. This may cause pain during ejaculation.  An inflamed urethra (urethritis) or inflamed prostate (prostatitis). This can make genital stimulation painful or uncomfortable.  Deformities of the penis, such as Peyronie's disease.  A tight foreskin.  Cancer of the female organs.  Psychological problems. This may make pain worse. DIAGNOSIS   Your caregiver will take a history and have you describe where the pain is located (outside the vagina, in the vagina, in the pelvis). You may be asked when you experience pain, such as with penetration or with thrusting.  Following this, your caregiver will do a physical exam. Let your caregiver know if the exam is too painful.  During the final part of the female exam, your caregiver will feel your uterus and ovaries with one hand on the abdomen and one finger in your vagina. This is a pelvic exam.  Blood tests, a Pap test, cultures for infection, an ultrasound test, and X-rays may be done. You may need to see a specialist for female problems (gynecologist).  Your caregiver may do a CT scan, MRI, or laparoscopy. Laparoscopy is a procedure to look into the pelvis with a lighted tube, through a cut (incision) in the abdomen. TREATMENT  Your caregiver can help you determine the best course of treatment. Sometimes, more testing is done. Continue with the suggested testing until your caregiver feels sure about your diagnosis and how to treat it. Sometimes, it is difficult to find the reason for the pain. The search  for the cause and treatment can be frustrating. Treatment often takes several weeks to a few months before you notice any improvement. You may also need to avoid sexual activity until symptoms improve.Continuing to have sex when it hurts can delay healing and actually make the problem worse. The treatment depends on the cause of the pain. Treatment may include:  Medicines such as antibiotics, vaginal or skin creams, hormones, or antidepressants.  Minor or major surgery.  Psychological counseling or group therapy.  Kegel exercises and vaginal dilators to help certain cases of vaginismus (spasms). Do this only if recommended by your caregiver.Kegel exercises can make some problems worse.  Applying lubrication as recommended by your caregiver if you have dryness.  Sex therapy for you and your sex partner. It is common for the pain to continue after the reason for the pain has been treated. Some reasons for this include a conditioned response. This means the person having the pain becomes so familiar with the pain that the pain continues as a response, even though the cause is removed. Sex therapy can help with this problem. HOME CARE INSTRUCTIONS   Follow your caregiver's instructions about taking medicines, tests, counseling, and follow-up treatment.  Do not use scented tampons, douches, vaginal sprays, or soaps.  Use water-based lubricants for dryness. Oil lubricants  can cause irritation.  Do not use spermicides or condoms that irritate you.  Openly discuss with your partner your sexual experience, your desires, foreplay, and different sexual positions for a more comfortable and enjoyable sexual relationship.  Join group sessions for therapy, if needed.  Practice safe sex at all times.  Empty your bladder before having intercourse.  Try different positions during sexual intercourse.  Take over-the-counter pain medicine recommended by your caregiver before having sexual  intercourse.  Do not wear pantyhose. Knee-high and thigh-high hose are okay.  Avoid scrubbing your vulva with a washcloth. Wash the area gently and pat dry with a towel. SEEK MEDICAL CARE IF:   You develop vaginal bleeding after sexual intercourse.  You develop a lump at the opening of your vagina, even if it is not painful.  You have abnormal vaginal discharge.  You have vaginal dryness.  You have itching or irritation of the vulva or vagina.  You develop a rash or reaction to your medicine. SEEK IMMEDIATE MEDICAL CARE IF:   You develop severe abdominal pain during or shortly after sexual intercourse. You could have a ruptured ovarian cyst or ruptured tubal pregnancy.  You have a fever.  You have painful or bloody urination.  You have painful sexual intercourse, and you never had it before.  You pass out after having sexual intercourse. Document Released: 03/30/2007 Document Revised: 06/02/2011 Document Reviewed: 06/10/2010 [facility]ExitCare Patient Information 2015 [place]ExitCare, [place]. This information is not intended to replace advice given to you by your health care provider. Make sure you discuss any questions you have with your health care provider.

## 2013-12-31 LAB — GC/CHLAMYDIA PROBE AMP
CT PROBE, AMP APTIMA: NEGATIVE
GC Probe RNA: NEGATIVE

## 2014-09-08 ENCOUNTER — Ambulatory Visit (INDEPENDENT_AMBULATORY_CARE_PROVIDER_SITE_OTHER): Payer: BC Managed Care – PPO | Admitting: Family Medicine

## 2014-09-08 VITALS — BP 118/72 | HR 67 | Temp 98.6°F | Resp 18 | Ht 63.25 in | Wt 139.2 lb

## 2014-09-08 DIAGNOSIS — R3915 Urgency of urination: Secondary | ICD-10-CM

## 2014-09-08 DIAGNOSIS — N760 Acute vaginitis: Secondary | ICD-10-CM

## 2014-09-08 DIAGNOSIS — A499 Bacterial infection, unspecified: Secondary | ICD-10-CM | POA: Diagnosis not present

## 2014-09-08 DIAGNOSIS — B9689 Other specified bacterial agents as the cause of diseases classified elsewhere: Secondary | ICD-10-CM

## 2014-09-08 DIAGNOSIS — N9489 Other specified conditions associated with female genital organs and menstrual cycle: Secondary | ICD-10-CM | POA: Diagnosis not present

## 2014-09-08 DIAGNOSIS — N898 Other specified noninflammatory disorders of vagina: Secondary | ICD-10-CM

## 2014-09-08 LAB — POCT WET PREP WITH KOH
Clue Cells Wet Prep HPF POC: NEGATIVE
KOH Prep POC: NEGATIVE
Trichomonas, UA: NEGATIVE
Yeast Wet Prep HPF POC: NEGATIVE

## 2014-09-08 LAB — POCT UA - MICROSCOPIC ONLY
Casts, Ur, LPF, POC: NEGATIVE
Crystals, Ur, HPF, POC: NEGATIVE
Mucus, UA: NEGATIVE
RBC, urine, microscopic: NEGATIVE
Yeast, UA: NEGATIVE

## 2014-09-08 LAB — POCT URINALYSIS DIPSTICK
Bilirubin, UA: NEGATIVE
Blood, UA: NEGATIVE
Glucose, UA: NEGATIVE
Ketones, UA: NEGATIVE
Nitrite, UA: NEGATIVE
Protein, UA: NEGATIVE
Spec Grav, UA: 1.025
Urobilinogen, UA: 1
pH, UA: 7

## 2014-09-08 LAB — POCT URINE PREGNANCY: Preg Test, Ur: NEGATIVE

## 2014-09-08 MED ORDER — METRONIDAZOLE 500 MG PO TABS: 500.0000 mg | ORAL_TABLET | Freq: Two times a day (BID) | ORAL | Status: DC

## 2014-09-08 NOTE — Progress Notes (Addendum)
Subjective:    Patient ID: Cassie Smith, female    DOB: September 18, 1990, 24 y.o.   MRN: [id number] This chart was scribed for Elvina Sidle, MD by Littie Deeds, Medical Scribe. This patient was seen in Room 14 and the patient's care was started at 4:56 PM.   HPI HPI Comments: Cassie Smith is a 24 y.o. female who presents to the [facility] complaining of malodorous urine that started a few days ago. Patient also reports having a small amount of hematuria, mild dysuria (towards the end of the stream), and left flank pain. She denies fever, nausea and vomiting. She is not married, but she does have a partner. Patient would also like to be checked for STD's.  Patient is monogamous. 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  Review of Systems  Genitourinary: Positive for dysuria, hematuria and flank pain.       Objective:   Physical Exam CONSTITUTIONAL: Well developed/well nourished HEAD: Normocephalic/atraumatic EYES: EOM/PERRL ENMT: Mucous membranes moist NECK: supple no meningeal signs SPINE: entire spine nontender CV: S1/S2 noted, no murmurs/rubs/gallops noted LUNGS: Lungs are clear to auscultation bilaterally, no apparent distress ABDOMEN: soft, nontender, no rebound or guarding GU: Scant vaginal discharge with mild fishy odor, no pelvic tenderness, no adnexal enlargement or uterine enlargement. NEURO: Pt is awake/alert, moves all extremitiesx4 EXTREMITIES: pulses normal, full ROM SKIN: warm, color normal PSYCH: no abnormalities of mood noted Results for orders placed or performed in visit on 09/08/14  POCT UA - Microscopic Only  Result Value Ref Range   WBC, Ur, HPF, POC 3-5    RBC, urine, microscopic neg    Bacteria, U Microscopic trace    Mucus, UA neg    Epithelial cells, urine per micros 1-3    Crystals, Ur, HPF, POC neg    Casts, Ur, LPF, POC neg    Yeast, UA neg   POCT urinalysis dipstick    Result Value Ref Range   Color, UA yellow    Clarity, UA clear    Glucose, UA neg    Bilirubin, UA neg    Ketones, UA neg    Spec Grav, UA 1.025    Blood, UA neg    pH, UA 7.0    Protein, UA neg    Urobilinogen, UA 1.0    Nitrite, UA neg    Leukocytes, UA Trace (A) Negative  POCT Wet Prep with KOH  Result Value Ref Range   Trichomonas, UA Negative    Clue Cells Wet Prep HPF POC neg    Epithelial Wet Prep HPF POC Moderate Few, Moderate, Many   Yeast Wet Prep HPF POC neg    Bacteria Wet Prep HPF POC Few Few   RBC Wet Prep HPF POC few    WBC Wet Prep HPF POC few    KOH Prep POC Negative   POCT urine pregnancy  Result Value Ref Range   Preg Test, Ur Negative Negative       Assessment & Plan:   This chart was scribed in my presence and reviewed by me personally.    ICD-9-CM ICD-10-CM   1. Urinary urgency 788.63 R39.15 POCT UA - Microscopic Only     POCT urinalysis dipstick     Urine culture     HIV antibody     RPR     GC/Chlamydia Probe Amp     POCT Wet Prep with KOH  POCT urine pregnancy  2. Vaginal odor 625.8 N94.89 POCT Wet Prep with KOH     POCT urine pregnancy  3. Bacterial vaginitis 616.10 N76.0    041.9 A49.9      Signed, Elvina Sidle, MD

## 2014-09-08 NOTE — Patient Instructions (Signed)
Bacterial Vaginosis Bacterial vaginosis is a vaginal infection that occurs when the normal balance of bacteria in the vagina is disrupted. It results from an overgrowth of certain bacteria. This is the most common vaginal infection in women of childbearing age. Treatment is important to prevent complications, especially in pregnant women, as it can cause a premature delivery. CAUSES  Bacterial vaginosis is caused by an increase in harmful bacteria that are normally present in smaller amounts in the vagina. Several different kinds of bacteria can cause bacterial vaginosis. However, the reason that the condition develops is not fully understood. RISK FACTORS Certain activities or behaviors can put you at an increased risk of developing bacterial vaginosis, including:  Having a new sex partner or multiple sex partners.  Douching.  Using an intrauterine device (IUD) for contraception. Women do not get bacterial vaginosis from toilet seats, bedding, swimming pools, or contact with objects around them. SIGNS AND SYMPTOMS  Some women with bacterial vaginosis have no signs or symptoms. Common symptoms include:  Grey vaginal discharge.  A fishlike odor with discharge, especially after sexual intercourse.  Itching or burning of the vagina and vulva.  Burning or pain with urination. DIAGNOSIS  Your health care provider will take a medical history and examine the vagina for signs of bacterial vaginosis. A sample of vaginal fluid may be taken. Your health care provider will look at this sample under a microscope to check for bacteria and abnormal cells. A vaginal pH test may also be done.  TREATMENT  Bacterial vaginosis may be treated with antibiotic medicines. These may be given in the form of a pill or a vaginal cream. A second round of antibiotics may be prescribed if the condition comes back after treatment.  HOME CARE INSTRUCTIONS   Only take over-the-counter or prescription medicines as  directed by your health care provider.  If antibiotic medicine was prescribed, take it as directed. Make sure you finish it even if you start to feel better.  Do not have sex until treatment is completed.  Tell all sexual partners that you have a vaginal infection. They should see their health care provider and be treated if they have problems, such as a mild rash or itching.  Practice safe sex by using condoms and only having one sex partner. SEEK MEDICAL CARE IF:   Your symptoms are not improving after 3 days of treatment.  You have increased discharge or pain.  You have a fever. MAKE SURE YOU:   Understand these instructions.  Will watch your condition.  Will get help right away if you are not doing well or get worse. FOR MORE INFORMATION  Centers for Disease Control and Prevention, Division of STD Prevention: www.cdc.gov/std American Sexual Health Association (ASHA): www.ashastd.org  Document Released: 03/10/2005 Document Revised: 12/29/2012 Document Reviewed: 10/20/2012 ExitCare Patient Information 2015 [employer]. This information is not intended to replace advice given to you by your health care provider. Make sure you discuss any questions you have with your health care provider.  

## 2014-09-09 LAB — URINE CULTURE
Colony Count: NO GROWTH
Organism ID, Bacteria: NO GROWTH

## 2014-09-09 LAB — HIV ANTIBODY (ROUTINE TESTING W REFLEX): HIV 1&2 Ab, 4th Generation: NONREACTIVE

## 2014-09-09 LAB — GC/CHLAMYDIA PROBE AMP
CT Probe RNA: NEGATIVE
GC Probe RNA: NEGATIVE

## 2014-09-09 LAB — RPR

## 2015-02-08 ENCOUNTER — Ambulatory Visit (INDEPENDENT_AMBULATORY_CARE_PROVIDER_SITE_OTHER): Payer: BC Managed Care – PPO | Admitting: Urgent Care

## 2015-02-08 ENCOUNTER — Encounter: Payer: Self-pay | Admitting: Urgent Care

## 2015-02-08 VITALS — BP 103/63 | HR 105 | Temp 102.3°F | Resp 16 | Ht 64.0 in | Wt 132.6 lb

## 2015-02-08 DIAGNOSIS — N10 Acute tubulo-interstitial nephritis: Secondary | ICD-10-CM | POA: Diagnosis not present

## 2015-02-08 DIAGNOSIS — R112 Nausea with vomiting, unspecified: Secondary | ICD-10-CM

## 2015-02-08 DIAGNOSIS — R109 Unspecified abdominal pain: Secondary | ICD-10-CM

## 2015-02-08 DIAGNOSIS — D649 Anemia, unspecified: Secondary | ICD-10-CM

## 2015-02-08 DIAGNOSIS — R824 Acetonuria: Secondary | ICD-10-CM | POA: Diagnosis not present

## 2015-02-08 DIAGNOSIS — N926 Irregular menstruation, unspecified: Secondary | ICD-10-CM | POA: Diagnosis not present

## 2015-02-08 DIAGNOSIS — R1084 Generalized abdominal pain: Secondary | ICD-10-CM

## 2015-02-08 LAB — COMPREHENSIVE METABOLIC PANEL
ALK PHOS: 58 U/L (ref 33–115)
ALT: 7 U/L (ref 6–29)
AST: 14 U/L (ref 10–30)
Albumin: 4.5 g/dL (ref 3.6–5.1)
BILIRUBIN TOTAL: 1.1 mg/dL (ref 0.2–1.2)
BUN: 5 mg/dL — ABNORMAL LOW (ref 7–25)
CALCIUM: 9 mg/dL (ref 8.6–10.2)
CO2: 25 mmol/L (ref 20–31)
Chloride: 98 mmol/L (ref 98–110)
Creat: 0.64 mg/dL (ref 0.50–1.10)
Glucose, Bld: 83 mg/dL (ref 65–99)
Potassium: 3.5 mmol/L (ref 3.5–5.3)
Sodium: 135 mmol/L (ref 135–146)
TOTAL PROTEIN: 7.8 g/dL (ref 6.1–8.1)

## 2015-02-08 LAB — POCT URINALYSIS DIP (MANUAL ENTRY)
BILIRUBIN UA: NEGATIVE
Glucose, UA: NEGATIVE
Nitrite, UA: NEGATIVE
PH UA: 7.5
Protein Ur, POC: 30 — AB
Spec Grav, UA: 1.02
Urobilinogen, UA: 2

## 2015-02-08 LAB — POCT CBC
Granulocyte percent: 83.3 %G — AB (ref 37–80)
HCT, POC: 30.9 % — AB (ref 37.7–47.9)
Hemoglobin: 10 g/dL — AB (ref 12.2–16.2)
LYMPH, POC: 1.7 (ref 0.6–3.4)
MCH, POC: 21.9 pg — AB (ref 27–31.2)
MCHC: 32.3 g/dL (ref 31.8–35.4)
MCV: 67.8 fL — AB (ref 80–97)
MID (cbc): 0.5 (ref 0–0.9)
MPV: 6.8 fL (ref 0–99.8)
POC Granulocyte: 11.2 — AB (ref 2–6.9)
POC LYMPH %: 12.9 % (ref 10–50)
POC MID %: 3.8 % (ref 0–12)
Platelet Count, POC: 304 10*3/uL (ref 142–424)
RBC: 4.56 M/uL (ref 4.04–5.48)
RDW, POC: 19.5 %
WBC: 13.4 10*3/uL — AB (ref 4.6–10.2)

## 2015-02-08 LAB — POC MICROSCOPIC URINALYSIS (UMFC): Mucus: ABSENT

## 2015-02-08 LAB — HIV ANTIBODY (ROUTINE TESTING W REFLEX): HIV 1&2 Ab, 4th Generation: NONREACTIVE

## 2015-02-08 LAB — HEMOGLOBIN A1C: HEMOGLOBIN A1C: 5.2 % (ref 4.0–6.0)

## 2015-02-08 LAB — GLUCOSE, POCT (MANUAL RESULT ENTRY): POC GLUCOSE: 124 mg/dL — AB (ref 70–99)

## 2015-02-08 LAB — POCT GLYCOSYLATED HEMOGLOBIN (HGB A1C)

## 2015-02-08 LAB — POCT URINE PREGNANCY: Preg Test, Ur: NEGATIVE

## 2015-02-08 MED ORDER — CIPROFLOXACIN HCL 500 MG PO TABS: 500.0000 mg | ORAL_TABLET | Freq: Two times a day (BID) | ORAL | Status: DC

## 2015-02-08 MED ORDER — TRAMADOL HCL 50 MG PO TABS: 50.0000 mg | ORAL_TABLET | Freq: Three times a day (TID) | ORAL | Status: DC | PRN

## 2015-02-08 MED ORDER — ONDANSETRON 8 MG PO TBDP: 8.0000 mg | ORAL_TABLET | Freq: Three times a day (TID) | ORAL | Status: DC | PRN

## 2015-02-08 MED ORDER — CEFTRIAXONE SODIUM 1 G IJ SOLR
1.0000 g | Freq: Once | INTRAMUSCULAR | Status: AC
Start: 2015-02-08 — End: 2015-02-08
  Administered 2015-02-08: 1 g via INTRAMUSCULAR

## 2015-02-08 NOTE — Patient Instructions (Signed)

## 2015-02-08 NOTE — Progress Notes (Signed)
MRN: [id number] DOB: 1990/04/04  Subjective:   Cassie Smith is a 24 y.o. female presenting for chief complaint of Abdominal Pain; Back Pain; Flank Pain; and Nausea  Reports 5 day history of abdominal cramping associated with the onset of her menstrual cycle. These were worse than her normal menstrual cramps, progressed into generalized belly pain, nausea with several episodes of vomiting, decreased appetite and subjective fever. She has been trying to hydrate but feels upset stomach, using NSAIDs for belly pain. Has a history of chlamydia a few years ago. Denies dysuria, hematuria, genital rashes, abnormal vaginal discharge. Has regular cycles that last ~8 days. This one lasted 4 days and was heavy for 3 days. Of note, patient has multiple family members with history of insulin-dependent diabetes. She admits that she has had fatigue, weight loss over the past several months. Denies any other aggravating or relieving factors, no other questions or concerns.  Charmain has a current medication list which includes the following prescription(s): ibuprofen, acyclovir, and metronidazole. Also has No Known Allergies.  Maanvi  has no past medical history on file. Also  has past surgical history that includes Leg Surgery.  Objective:   Vitals: BP 103/63 mmHg  Pulse 105  Temp(Src) 102.3 F (39.1 C) (Oral)  Resp 16  Ht  (1.626 m)  Wt 132 lb 9.6 oz (60.147 kg)  BMI 22.75 kg/m2  LMP 02/03/2015  Physical Exam  Constitutional: She is oriented to person, place, and time. She appears well-developed and well-nourished.  Cardiovascular: Normal rate, regular rhythm and intact distal pulses.  Exam reveals no gallop and no friction rub.   No murmur heard. Pulmonary/Chest: No respiratory distress. She has no wheezes. She has no rales.  Abdominal: Soft. Bowel sounds are normal. She exhibits no distension and no mass. There is tenderness (generalized throughout, right-sided CVA tenderness).  Neurological:  She is alert and oriented to person, place, and time.  Skin: Skin is warm and dry. No rash noted. No erythema. No pallor.   Results for orders placed or performed in visit on 02/08/15 (from the past 24 hour(s))  POCT CBC     Status: Abnormal   Collection Time: 02/08/15  4:21 PM  Result Value Ref Range   WBC 13.4 (A) 4.6 - 10.2 K/uL   Lymph, poc 1.7 0.6 - 3.4   POC LYMPH PERCENT 12.9 10 - 50 %L   MID (cbc) 0.5 0 - 0.9   POC MID % 3.8 0 - 12 %M   POC Granulocyte 11.2 (A) 2 - 6.9   Granulocyte percent 83.3 (A) 37 - 80 %G   RBC 4.56 4.04 - 5.48 M/uL   Hemoglobin 10.0 (A) 12.2 - 16.2 g/dL   HCT, POC 40.9 (A) 81.1 - 47.9 %   MCV 67.8 (A) 80 - 97 fL   MCH, POC 21.9 (A) 27 - 31.2 pg   MCHC 32.3 31.8 - 35.4 g/dL   RDW, POC 91.4 %   Platelet Count, POC 304 142 - 424 K/uL   MPV 6.8 0 - 99.8 fL  POCT urinalysis dipstick     Status: Abnormal   Collection Time: 02/08/15  4:45 PM  Result Value Ref Range   Color, UA yellow yellow   Clarity, UA cloudy (A) clear   Glucose, UA negative negative   Bilirubin, UA negative negative   Ketones, POC UA >= (160) (A) negative   Spec Grav, UA 1.020    Blood, UA large (A) negative   pH, UA  7.5    Protein Ur, POC =30 (A) negative   Urobilinogen, UA 2.0    Nitrite, UA Negative Negative   Leukocytes, UA small (1+) (A) Negative  POCT Microscopic Urinalysis ([facility])     Status: Abnormal   Collection Time: 02/08/15  4:45 PM  Result Value Ref Range   WBC,UR,HPF,POC Few (A) None WBC/hpf   RBC,UR,HPF,POC Moderate (A) None RBC/hpf   Bacteria Few (A) None, Too numerous to count   Mucus Absent Absent   Epithelial Cells, UR Per Microscopy Many (A) None, Too numerous to count cells/hpf  POCT urine pregnancy     Status: None   Collection Time: 02/08/15  4:46 PM  Result Value Ref Range   Preg Test, Ur Negative Negative   Assessment and Plan :   1. Acute pyelonephritis 2. Flank pain 3. Generalized abdominal pain 4. Irregular menstrual cycle 5. Nausea and  vomiting, intractability of vomiting not specified, unspecified vomiting type - Will treat patient for acute pyelo, start cipro. She is not pregnant but labs are pending for STI, CMet as well. Zofran for nausea and vomiting. Advised aggressive hydration. RTC in 2-3 days if no improvement.  6. Ketonuria - Screened for diabetes due to strong family history and UA, she was negative for diabetes.   7. Anemia, unspecified - Restart iron supplement, this is likely the cause of her fatigue in the past several months. Follow up with PCP.  Wallis BambergMario Calvin Chura, PA-C [facility] [phone] 02/08/2015 4:02 PM

## 2015-02-09 LAB — GC/CHLAMYDIA PROBE AMP
CT Probe RNA: NEGATIVE
GC Probe RNA: NEGATIVE

## 2015-02-09 LAB — RPR

## 2015-02-10 LAB — URINE CULTURE: Colony Count: >=100000

## 2015-02-22 ENCOUNTER — Encounter: Payer: Self-pay | Admitting: Family Medicine

## 2017-06-23 ENCOUNTER — Emergency Department (HOSPITAL_BASED_OUTPATIENT_CLINIC_OR_DEPARTMENT_OTHER)
Admission: EM | Admit: 2017-06-23 | Discharge: 2017-06-24 | Disposition: A | Discharge status: Home / Self Care | Payer: BC Managed Care – PPO | Attending: Emergency Medicine | Admitting: Emergency Medicine

## 2017-06-23 ENCOUNTER — Other Ambulatory Visit: Payer: Self-pay

## 2017-06-23 ENCOUNTER — Encounter (HOSPITAL_BASED_OUTPATIENT_CLINIC_OR_DEPARTMENT_OTHER): Payer: Self-pay

## 2017-06-23 DIAGNOSIS — X58XXXA Exposure to other specified factors, initial encounter: Secondary | ICD-10-CM | POA: Diagnosis not present

## 2017-06-23 DIAGNOSIS — T148XXA Other injury of unspecified body region, initial encounter: Secondary | ICD-10-CM | POA: Diagnosis not present

## 2017-06-23 DIAGNOSIS — Y998 Other external cause status: Secondary | ICD-10-CM | POA: Diagnosis not present

## 2017-06-23 DIAGNOSIS — Y929 Unspecified place or not applicable: Secondary | ICD-10-CM | POA: Diagnosis not present

## 2017-06-23 DIAGNOSIS — R350 Frequency of micturition: Secondary | ICD-10-CM | POA: Diagnosis present

## 2017-06-23 DIAGNOSIS — Y939 Activity, unspecified: Secondary | ICD-10-CM | POA: Diagnosis not present

## 2017-06-23 DIAGNOSIS — N939 Abnormal uterine and vaginal bleeding, unspecified: Secondary | ICD-10-CM | POA: Insufficient documentation

## 2017-06-23 DIAGNOSIS — R102 Pelvic and perineal pain: Secondary | ICD-10-CM | POA: Diagnosis not present

## 2017-06-23 LAB — URINALYSIS, ROUTINE W REFLEX MICROSCOPIC
Bilirubin Urine: NEGATIVE
GLUCOSE, UA: NEGATIVE mg/dL
Ketones, ur: 40 mg/dL — AB
Leukocytes, UA: NEGATIVE
Nitrite: NEGATIVE
PH: 6 (ref 5.0–8.0)
Protein, ur: NEGATIVE mg/dL
Specific Gravity, Urine: 1.025 (ref 1.005–1.030)

## 2017-06-23 LAB — WET PREP, GENITAL
Clue Cells Wet Prep HPF POC: NONE SEEN
Sperm: NONE SEEN
TRICH WET PREP: NONE SEEN
YEAST WET PREP: NONE SEEN

## 2017-06-23 LAB — URINALYSIS, MICROSCOPIC (REFLEX): WBC, UA: NONE SEEN WBC/hpf (ref 0–5)

## 2017-06-23 LAB — PREGNANCY, URINE: Preg Test, Ur: NEGATIVE

## 2017-06-23 MED ORDER — KETOROLAC TROMETHAMINE 60 MG/2ML IM SOLN
60.0000 mg | Freq: Once | INTRAMUSCULAR | Status: AC
  Administered 2017-06-24: 60 mg via INTRAMUSCULAR
  Filled 2017-06-23 (×2): qty 2

## 2017-06-23 MED ORDER — ACETAMINOPHEN 500 MG PO TABS
1000.0000 mg | ORAL_TABLET | Freq: Once | ORAL | Status: AC
  Administered 2017-06-23: 1000 mg via ORAL
  Filled 2017-06-23: qty 2

## 2017-06-23 NOTE — ED Triage Notes (Signed)
Pt c/o urinary freq x today-vaginal bleeding started yesterday-LMP ended last week-NAD-steady gait

## 2017-06-23 NOTE — ED Provider Notes (Signed)
[facility] EMERGENCY DEPARTMENT Provider Note   CSN: [id number] Arrival date & time: 06/23/17  2244     History   Chief Complaint Chief Complaint  Patient presents with  . Urinary Frequency    HPI Cassie Smith is a 27 y.o. female.  HPI   Lower abdominal pain. Training for th past few weeks, weight training. Yesterday after leaving work out had some vaginal bleeding and clots. LMP was Thursday, had a lot of clots. Was bleeding today. No pain, then did some ab work outs had pain and urinary frequency. Would feel like needing to urinate. Lower pain and pain in the vagina.  Sitting down was uncomfortable, except sitting on toilet.  Sharp pain some cramping.  No nausea or vomiting. No fever. No appetite change.  Reports has had some fluid and bleeding.      Has changed diet  [place] wax Thurs, went to beach    History reviewed. No pertinent past medical history.  There are no active problems to display for this patient.   Past Surgical History:  Procedure Laterality Date  . LEG SURGERY       OB History   None      Home Medications    Prior to Admission medications   Medication Sig Start Date End Date Taking? Authorizing Provider  acyclovir (ZOVIRAX) 400 MG tablet Take 1 tablet (400 mg total) by mouth 5 (five) times daily. Patient not taking: Reported on 09/08/2014 06/16/13   Pilar Jarvis, MD  ciprofloxacin (CIPRO) 500 MG tablet Take 1 tablet (500 mg total) by mouth 2 (two) times daily. 02/08/15   Wallis Bamberg, PA-C  ibuprofen (ADVIL,MOTRIN) 800 MG tablet Take 1 tablet (800 mg total) by mouth 3 (three) times daily. 12/30/13   Gilda Crease, MD  metroNIDAZOLE (FLAGYL) 500 MG tablet Take 1 tablet (500 mg total) by mouth 2 (two) times daily with a meal. DO NOT CONSUME ALCOHOL WHILE TAKING THIS MEDICATION. Patient not taking: Reported on 02/08/2015 09/08/14   Elvina Sidle, MD  ondansetron (ZOFRAN-ODT) 8 MG disintegrating tablet Take 1 tablet (8 mg  total) by mouth every 8 (eight) hours as needed for nausea. 02/08/15   Wallis Bamberg, PA-C  phenazopyridine (PYRIDIUM) 200 MG tablet Take 1 tablet (200 mg total) by mouth 3 (three) times daily. 06/24/17   Alvira Monday, MD  traMADol (ULTRAM) 50 MG tablet Take 1 tablet (50 mg total) by mouth every 8 (eight) hours as needed. 02/08/15   Wallis Bamberg, PA-C    Family History No family history on file.  Social History Social History   Tobacco Use  . Smoking status: Never Smoker  . Smokeless tobacco: Never Used  Substance Use Topics  . Alcohol use: Yes    Comment: occ  . Drug use: No     Allergies   Patient has no known allergies.   Review of Systems Review of Systems  Constitutional: Negative for fever.  HENT: Negative for sore throat.   Eyes: Negative for visual disturbance.  Respiratory: Negative for cough and shortness of breath.   Cardiovascular: Negative for chest pain.  Gastrointestinal: Positive for abdominal pain. Negative for constipation, diarrhea, nausea and vomiting.  Genitourinary: Positive for frequency, pelvic pain, urgency, vaginal bleeding and vaginal pain. Negative for difficulty urinating.  Musculoskeletal: Negative for back pain and neck pain.  Skin: Negative for rash.  Neurological: Negative for syncope and headaches.     Physical Exam Updated Vital Signs BP 115/88 (BP Location: Left Arm)  Pulse 72   Temp 98 F (36.7 C) (Oral)   Resp 18   Ht 5\' 3"  (1.6 m)   Wt 64.3 kg (141 lb 12.1 oz)   LMP 06/11/2017   SpO2 98%   BMI 25.11 kg/m   Physical Exam  Constitutional: She is oriented to person, place, and time. She appears well-developed and well-nourished. No distress.  HENT:  Head: Normocephalic and atraumatic.  Eyes: Conjunctivae and EOM are normal.  Neck: Normal range of motion.  Cardiovascular: Normal rate, regular rhythm, normal heart sounds and intact distal pulses. Exam reveals no gallop and no friction rub.  No murmur  heard. Pulmonary/Chest: Effort normal and breath sounds normal. No respiratory distress. She has no wheezes. She has no rales.  Abdominal: Soft. She exhibits no distension. There is no tenderness. There is no guarding.  Genitourinary: Vagina normal. Uterus is not tender. Cervix exhibits discharge (scant). Cervix exhibits no motion tenderness. Right adnexum displays no tenderness. Left adnexum displays no tenderness. No vaginal discharge found.  Genitourinary Comments: No sign of prolapse No sign of inguinal or pelvic hernia   Musculoskeletal: She exhibits no edema or tenderness.  Neurological: She is alert and oriented to person, place, and time.  Skin: Skin is warm and dry. No rash noted. She is not diaphoretic. No erythema.  Nursing note and vitals reviewed.    ED Treatments / Results  Labs (all labs ordered are listed, but only abnormal results are displayed) Labs Reviewed  WET PREP, GENITAL - Abnormal; Notable for the following components:      Result Value   WBC, Wet Prep HPF POC MODERATE (*)    All other components within normal limits  URINALYSIS, ROUTINE W REFLEX MICROSCOPIC - Abnormal; Notable for the following components:   Hgb urine dipstick SMALL (*)    Ketones, ur 40 (*)    All other components within normal limits  URINALYSIS, MICROSCOPIC (REFLEX) - Abnormal; Notable for the following components:   Bacteria, UA RARE (*)    Squamous Epithelial / LPF 0-5 (*)    All other components within normal limits  PREGNANCY, URINE  GC/CHLAMYDIA PROBE AMP ([place]) NOT AT [facility]    EKG None  Radiology No results found.  Procedures Procedures (including critical care time)  Medications Ordered in ED Medications  ketorolac (TORADOL) injection 60 mg (60 mg Intramuscular Given 06/24/17 0020)  acetaminophen (TYLENOL) tablet 1,000 mg (1,000 mg Oral Given 06/23/17 2354)     Initial Impression / Assessment and Plan / ED Course  I have reviewed the triage vital signs and the  nursing notes.  Pertinent labs & imaging results that were available during my care of the patient were reviewed by me and considered in my medical decision making (see chart for details).    27 year old female presents with concern for lower abdominal cramping, vaginal pain, urinary symptoms. UA shows no sign of infection.  Has hgb on UA, however pt without colicky flank pain, doubt ureteral lithiasis, but did discuss lower stones were on differential. Recommend recheck UA with PCP as outpatient.  Doubt torsion given pt without colicky pain and it is not localized to one side. She appears comfortable on exam. Reports she has not been sexually active and given exam doubt PID.  Offered empiric treatment for GC/Chl but pt would like to wait for results. Pregnancy test negative.  Low suspicion for appendicitis, diverticulitis by history and exam.  Do not see signs of hernia, including no clinical history or  signs to suggest incarcerated or strangulated hernia. Do not see signs of prolapse on exam.  Do consider pelvic floor abnormality as etiology of pain given history, hx of weight lifting. Feel muscular strain is likely. Also consider possible allergic type urethritis as etiology. Recommend no lifting, loose fitting clothing, follow up with PCP and consider follow up with women's clinic. Given toradol in ED for pain. Recommend tylenol/ibuprofen. Patient discharged in stable condition with understanding of reasons to return.   Final Clinical Impressions(s) / ED Diagnoses   Final diagnoses:  Pelvic pain  Muscle strain    ED Discharge Orders        Ordered    phenazopyridine (PYRIDIUM) 200 MG tablet  3 times daily     06/24/17 Abbie Sons, MD 06/24/17 [phone]

## 2017-06-24 MED ORDER — PHENAZOPYRIDINE HCL 200 MG PO TABS: 200.0000 mg | ORAL_TABLET | Freq: Three times a day (TID) | ORAL | 0 refills | Status: DC

## 2017-06-24 NOTE — Discharge Instructions (Addendum)
Take Tylenol 1000 mg 4 times a day for 1 week. This is the maximum dose of Tylenol usually take from all sources. Please check other over-the-counter medications and prescriptions to ensure you are not taking other medications that contain acetaminophen.  You may also take ibuprofen 400 mg 6 times a day (or 600mg  4 times a day) alternating with or at the same time as tylenol.  Do not lift until you have improvement of your symptoms or as directed by your regular physician. Avoid straining.

## 2017-06-25 LAB — GC/CHLAMYDIA PROBE AMP (~~LOC~~) NOT AT ARMC
CHLAMYDIA, DNA PROBE: NEGATIVE
Neisseria Gonorrhea: NEGATIVE

## 2017-08-04 ENCOUNTER — Encounter: Payer: Self-pay | Admitting: Emergency Medicine

## 2017-08-04 ENCOUNTER — Telehealth: Payer: Self-pay | Admitting: *Deleted

## 2017-08-04 ENCOUNTER — Ambulatory Visit (INDEPENDENT_AMBULATORY_CARE_PROVIDER_SITE_OTHER): Payer: BC Managed Care – PPO | Admitting: Emergency Medicine

## 2017-08-04 VITALS — BP 117/82 | HR 78 | Temp 97.8°F | Resp 17 | Ht 63.0 in | Wt 140.0 lb

## 2017-08-04 DIAGNOSIS — IMO0001 Reserved for inherently not codable concepts without codable children: Secondary | ICD-10-CM

## 2017-08-04 DIAGNOSIS — A059 Bacterial foodborne intoxication, unspecified: Secondary | ICD-10-CM | POA: Insufficient documentation

## 2017-08-04 DIAGNOSIS — T6291XA Toxic effect of unspecified noxious substance eaten as food, accidental (unintentional), initial encounter: Secondary | ICD-10-CM

## 2017-08-04 DIAGNOSIS — R112 Nausea with vomiting, unspecified: Secondary | ICD-10-CM | POA: Diagnosis not present

## 2017-08-04 MED ORDER — ONDANSETRON HCL 4 MG/2ML IJ SOLN
2.0000 mg | Freq: Once | INTRAMUSCULAR | Status: AC
  Administered 2017-08-04: 2 mg via INTRAMUSCULAR

## 2017-08-04 MED ORDER — ONDANSETRON HCL 4 MG PO TABS: 4.0000 mg | ORAL_TABLET | Freq: Three times a day (TID) | ORAL | 0 refills | Status: DC | PRN

## 2017-08-04 MED ORDER — ONDANSETRON HCL 4 MG PO TABS
4.0000 mg | ORAL_TABLET | Freq: Three times a day (TID) | ORAL | 0 refills | Status: AC | PRN
Start: 2017-08-04 — End: ?

## 2017-08-04 NOTE — Progress Notes (Signed)
Cassie Smith 27 y.o.   Chief Complaint  Patient presents with  . Nausea  . Emesis    HISTORY OF PRESENT ILLNESS: This is a 27 y.o. female complaining of nausea and vomiting that started today about 1 hour after eating eggroll at [employer].  Denies diarrhea or significant abdominal pain.  Aunt also getting sick after eating the same time.  No other significant symptoms.  Denies pregnancy.  HPI   Prior to Admission medications   Not on File    No Known Allergies  There are no active problems to display for this patient.   No past medical history on file.  Past Surgical History:  Procedure Laterality Date  . LEG SURGERY      Social History   Socioeconomic History  . Marital status: Single    Spouse name: Not on file  . Number of children: Not on file  . Years of education: Not on file  . Highest education level: Not on file  Occupational History  . Not on file  Social Needs  . Financial resource strain: Not on file  . Food insecurity:    Worry: Not on file    Inability: Not on file  . Transportation needs:    Medical: Not on file    Non-medical: Not on file  Tobacco Use  . Smoking status: Never Smoker  . Smokeless tobacco: Never Used  Substance and Sexual Activity  . Alcohol use: Yes    Comment: occ  . Drug use: No  . Sexual activity: Yes    Birth control/protection: None  Lifestyle  . Physical activity:    Days per week: Not on file    Minutes per session: Not on file  . Stress: Not on file  Relationships  . Social connections:    Talks on phone: Not on file    Gets together: Not on file    Attends religious service: Not on file    Active member of club or organization: Not on file    Attends meetings of clubs or organizations: Not on file    Relationship status: Not on file  . Intimate partner violence:    Fear of current or ex partner: Not on file    Emotionally abused: Not on file    Physically abused: Not on file    Forced sexual  activity: Not on file  Other Topics Concern  . Not on file  Social History Narrative  . Not on file    No family history on file.   Review of Systems  Constitutional: Negative.  Negative for chills and fever.  HENT: Negative.  Negative for sore throat.   Eyes: Negative.  Negative for discharge and redness.  Respiratory: Negative.  Negative for cough and shortness of breath.   Cardiovascular: Negative.  Negative for chest pain and palpitations.  Gastrointestinal: Positive for nausea and vomiting. Negative for abdominal pain and diarrhea.  Genitourinary: Negative.  Negative for dysuria, frequency and hematuria.  Musculoskeletal: Negative.  Negative for back pain, myalgias and neck pain.  Skin: Negative.  Negative for rash.  Neurological: Negative.  Negative for dizziness and headaches.  Endo/Heme/Allergies: Negative.   All other systems reviewed and are negative.   Vitals:   08/04/17 1602  BP: 117/82  Pulse: 78  Resp: 17  Temp: 97.8 F (36.6 C)  SpO2: 98%    Physical Exam  Constitutional: She appears well-developed and well-nourished.  HENT:  Head: Normocephalic and atraumatic.  Right Ear: External ear  normal.  Left Ear: External ear normal.  Nose: Nose normal.  Mouth/Throat: Oropharynx is clear and moist.  Eyes: Pupils are equal, round, and reactive to light. Conjunctivae and EOM are normal.  Neck: Normal range of motion. Neck supple.  Cardiovascular: Normal rate, regular rhythm and normal heart sounds.  Pulmonary/Chest: Effort normal and breath sounds normal.  Abdominal: Soft. Bowel sounds are normal. She exhibits no distension. There is no tenderness.  Lymphadenopathy:    She has no cervical adenopathy.  Vitals reviewed.    ASSESSMENT & PLAN: Hannah was seen today for nausea and emesis.  Diagnoses and all orders for this visit:  Nausea and vomiting, intractability of vomiting not specified, unspecified vomiting type -     ondansetron (ZOFRAN) injection 2  mg -     ondansetron (ZOFRAN) 4 MG tablet; Take 1 tablet (4 mg total) by mouth every 8 (eight) hours as needed for nausea or vomiting.  Food poisoning, accidental or unintentional, initial encounter    Patient Instructions       IF you received an x-ray today, you will receive an invoice from [facility]. Please contact [facility] at [phone] with questions or concerns regarding your invoice.   IF you received labwork today, you will receive an invoice from [facility]. Please contact [facility] at [phone] with questions or concerns regarding your invoice.   Our billing staff will not be able to assist you with questions regarding bills from these companies.  You will be contacted with the lab results as soon as they are available. The fastest way to get your results is to activate your My Chart account. Instructions are located on the last page of this paperwork. If you have not heard from [place] regarding the results in 2 weeks, please contact this office.      Nausea and Vomiting, Adult Feeling sick to your stomach (nausea) means that your stomach is upset or you feel like you have to throw up (vomit). Feeling more and more sick to your stomach can lead to throwing up. Throwing up happens when food and liquid from your stomach are thrown up and out the mouth. Throwing up can make you feel weak and cause you to get dehydrated. Dehydration can make you tired and thirsty, make you have a dry mouth, and make it so you pee (urinate) less often. Older adults and people with other diseases or a weak defense system (immune system) are at higher risk for dehydration. If you feel sick to your stomach or if you throw up, it is important to follow instructions from your doctor about how to take care of yourself. Follow these instructions at home: Eating and drinking Follow these instructions as told by your doctor:  Take an oral rehydration solution (ORS). This is a drink that  is sold at pharmacies and stores.  Drink clear fluids in small amounts as you are able, such as: ? Water. ? Ice chips. ? Diluted fruit juice. ? Low-calorie sports drinks.  Eat bland, easy-to-digest foods in small amounts as you are able, such as: ? Bananas. ? Applesauce. ? Rice. ? Low-fat (lean) meats. ? Toast. ? Crackers.  Avoid fluids that have a lot of sugar or caffeine in them.  Avoid alcohol.  Avoid spicy or fatty foods.  General instructions  Drink enough fluid to keep your pee (urine) clear or pale yellow.  Wash your hands often. If you cannot use soap and water, use hand sanitizer.  Make sure that all people in  your home wash their hands well and often.  Take over-the-counter and prescription medicines only as told by your doctor.  Rest at home while you get better.  Watch your condition for any changes.  Breathe slowly and deeply when you feel sick to your stomach.  Keep all follow-up visits as told by your doctor. This is important. Contact a doctor if:  You have a fever.  You cannot keep fluids down.  Your symptoms get worse.  You have new symptoms.  You feel sick to your stomach for more than two days.  You feel light-headed or dizzy.  You have a headache.  You have muscle cramps. Get help right away if:  You have pain in your chest, neck, arm, or jaw.  You feel very weak or you pass out (faint).  You throw up again and again.  You see blood in your throw-up.  Your throw-up looks like black coffee grounds.  You have bloody or black poop (stools) or poop that look like tar.  You have a very bad headache, a stiff neck, or both.  You have a rash.  You have very bad pain, cramping, or bloating in your belly (abdomen).  You have trouble breathing.  You are breathing very quickly.  Your heart is beating very quickly.  Your skin feels cold and clammy.  You feel confused.  You have pain when you pee.  You have signs of  dehydration, such as: ? Dark pee, hardly any pee, or no pee. ? Cracked lips. ? Dry mouth. ? Sunken eyes. ? Sleepiness. ? Weakness. These symptoms may be an emergency. Do not wait to see if the symptoms will go away. Get medical help right away. Call your local emergency services (911 in the [place] Do not drive yourself to the hospital. This information is not intended to replace advice given to you by your health care provider. Make sure you discuss any questions you have with your health care provider. Document Released: 08/27/2007 Document Revised: 09/28/2015 Document Reviewed: 11/14/2014 Elsevier Interactive Patient Education  2018 Elsevier Inc.      Edwina Barth, MD [facility]

## 2017-08-04 NOTE — Addendum Note (Signed)
Addended by: Georg Ruddle A on: 08/04/2017 04:41 PM   Modules accepted: Orders

## 2017-08-04 NOTE — Patient Instructions (Addendum)
   IF you received an x-ray today, you will receive an invoice from [facility]. Please contact [facility] at [phone] with questions or concerns regarding your invoice.   IF you received labwork today, you will receive an invoice from [facility]. Please contact [facility] at [phone] with questions or concerns regarding your invoice.   Our billing staff will not be able to assist you with questions regarding bills from these companies.  You will be contacted with the lab results as soon as they are available. The fastest way to get your results is to activate your My Chart account. Instructions are located on the last page of this paperwork. If you have not heard from us regarding the results in 2 weeks, please contact this office.     Nausea and Vomiting, Adult Feeling sick to your stomach (nausea) means that your stomach is upset or you feel like you have to throw up (vomit). Feeling more and more sick to your stomach can lead to throwing up. Throwing up happens when food and liquid from your stomach are thrown up and out the mouth. Throwing up can make you feel weak and cause you to get dehydrated. Dehydration can make you tired and thirsty, make you have a dry mouth, and make it so you pee (urinate) less often. Older adults and people with other diseases or a weak defense system (immune system) are at higher risk for dehydration. If you feel sick to your stomach or if you throw up, it is important to follow instructions from your doctor about how to take care of yourself. Follow these instructions at home: Eating and drinking Follow these instructions as told by your doctor:  Take an oral rehydration solution (ORS). This is a drink that is sold at pharmacies and stores.  Drink clear fluids in small amounts as you are able, such as: ? Water. ? Ice chips. ? Diluted fruit juice. ? Low-calorie sports drinks.  Eat bland, easy-to-digest foods in small amounts as you  are able, such as: ? Bananas. ? Applesauce. ? Rice. ? Low-fat (lean) meats. ? Toast. ? Crackers.  Avoid fluids that have a lot of sugar or caffeine in them.  Avoid alcohol.  Avoid spicy or fatty foods.  General instructions  Drink enough fluid to keep your pee (urine) clear or pale yellow.  Wash your hands often. If you cannot use soap and water, use hand sanitizer.  Make sure that all people in your home wash their hands well and often.  Take over-the-counter and prescription medicines only as told by your doctor.  Rest at home while you get better.  Watch your condition for any changes.  Breathe slowly and deeply when you feel sick to your stomach.  Keep all follow-up visits as told by your doctor. This is important. Contact a doctor if:  You have a fever.  You cannot keep fluids down.  Your symptoms get worse.  You have new symptoms.  You feel sick to your stomach for more than two days.  You feel light-headed or dizzy.  You have a headache.  You have muscle cramps. Get help right away if:  You have pain in your chest, neck, arm, or jaw.  You feel very weak or you pass out (faint).  You throw up again and again.  You see blood in your throw-up.  Your throw-up looks like black coffee grounds.  You have bloody or black poop (stools) or poop that look like tar.  You   have a very bad headache, a stiff neck, or both.  You have a rash.  You have very bad pain, cramping, or bloating in your belly (abdomen).  You have trouble breathing.  You are breathing very quickly.  Your heart is beating very quickly.  Your skin feels cold and clammy.  You feel confused.  You have pain when you pee.  You have signs of dehydration, such as: ? Dark pee, hardly any pee, or no pee. ? Cracked lips. ? Dry mouth. ? Sunken eyes. ? Sleepiness. ? Weakness. These symptoms may be an emergency. Do not wait to see if the symptoms will go away. Get medical help  right away. Call your local emergency services (911 in the [place] Do not drive yourself to the hospital. This information is not intended to replace advice given to you by your health care provider. Make sure you discuss any questions you have with your health care provider. Document Released: 08/27/2007 Document Revised: 09/28/2015 Document Reviewed: 11/14/2014 Elsevier Interactive Patient Education  2018 Elsevier Inc.  

## 2017-08-04 NOTE — Telephone Encounter (Signed)
Call pharmacy spoke Pam (pharmacist) to cancel the Zofran 4 mg tablets. Patient changed to another pharmacy ([facility]) on [address].

## 2017-08-18 ENCOUNTER — Ambulatory Visit: Payer: BC Managed Care – PPO | Admitting: Emergency Medicine

## 2017-08-31 ENCOUNTER — Encounter: Payer: Self-pay | Admitting: Family Medicine

## 2017-08-31 ENCOUNTER — Ambulatory Visit: Payer: Self-pay | Admitting: *Deleted

## 2017-08-31 ENCOUNTER — Ambulatory Visit: Payer: BC Managed Care – PPO | Admitting: Family Medicine

## 2017-08-31 ENCOUNTER — Other Ambulatory Visit: Payer: Self-pay

## 2017-08-31 VITALS — BP 120/80 | HR 74 | Temp 98.6°F | Ht 63.0 in | Wt 140.0 lb

## 2017-08-31 DIAGNOSIS — N939 Abnormal uterine and vaginal bleeding, unspecified: Secondary | ICD-10-CM

## 2017-08-31 DIAGNOSIS — R8290 Unspecified abnormal findings in urine: Secondary | ICD-10-CM | POA: Diagnosis not present

## 2017-08-31 LAB — POCT WET + KOH PREP
Trich by wet prep: ABSENT
Yeast by KOH: ABSENT
Yeast by wet prep: ABSENT

## 2017-08-31 LAB — POCT URINE PREGNANCY: PREG TEST UR: NEGATIVE

## 2017-08-31 MED ORDER — METRONIDAZOLE 0.75 % VA GEL: 1.0000 | Freq: Every day | VAGINAL | 0 refills | Status: AC

## 2017-08-31 NOTE — Telephone Encounter (Signed)
No severe bleeding or pain. Appointment made for today with Dr Creta LevinStallings by the agent prior to triaging the patient  Pt advised to call back if any increase in symptoms or any severe bleeding.  Reason for Disposition . [1] Bleeding or spotting between regular periods AND [2] occurs more than two cycles (2 months) this past year  Answer Assessment - Initial Assessment Questions 1. AMOUNT: "Describe the bleeding that you are having."    - SPOTTING: spotting, or pinkish / brownish mucous discharge; does not fill panti-liner or pad    - MILD:  less than 1 pad / hour; less than patient's usual menstrual bleeding   - MODERATE: 1-2 pads / hour; small-medium blood clots (e.g., pea, grape, small coin)    - SEVERE: soaking 2 or more pads/hour for 2 or more hours; bleeding not contained by pads or continuous red blood from vagina; large blood clots (e.g., golf ball, large coin)       Spotting   2. ONSET: "When did the bleeding begin?" "Is it continuing now?"      2  Weeks  Continuing   3. MENSTRUAL PERIOD: "When was the last normal menstrual period?" "How is this different than your period?"      04 Aug 2017  4. REGULARITY: "How regular are your periods?"      Lately have been irregular   5. ABDOMINAL PAIN: "Do you have any pain?" "How bad is the pain?"  (e.g., Scale 1-10; mild, moderate, or severe)   - MILD (1-3): doesn't interfere with normal activities, abdomen soft and not tender to touch    - MODERATE (4-7): interferes with normal activities or awakens from sleep, tender to touch    - SEVERE (8-10): excruciating pain, doubled over, unable to do any normal activities       Mild  Low abd pain l side  intermittant none now    6. PREGNANCY: "Could you be pregnant?" "Are you sexually active?" "Did you recently give birth?"      No sex 6-8 weeks  7. BREASTFEEDING: "Are you breastfeeding?"     no 8. HORMONES: "Are you taking any hormone medications, prescription or OTC?" (e.g., birth control pills,  estrogen)      None   9. BLOOD THINNERS: "Do you take any blood thinners?" (e.g., Coumadin/warfarin, Pradaxa/dabigatran, aspirin)     No  10. CAUSE: "What do you think is causing the bleeding?" (e.g., recent gyn surgery, recent gyn procedure; known bleeding disorder, cervical cancer, polycystic ovarian disease, fibroids)         No idea   11. HEMODYNAMIC STATUS: "Are you weak or feeling lightheaded?" If so, ask: "Can you stand and walk normally?"         no 12. OTHER SYMPTOMS: "What other symptoms are you having with the bleeding?" (e.g., passed tissue, vaginal discharge, fever, menstrual-type cramps)       Pt has unpleasant vag odor  approx 3  Weeks   occasonal  Cramps  Protocols used: VAGINAL BLEEDING - ABNORMAL-A-AH

## 2017-08-31 NOTE — Progress Notes (Signed)
Chief Complaint  Patient presents with  . Vaginal Bleeding    on and off bleeding for the past 2wks with odor. Says it may have started with the use of dial soap. Usually uses dove soap. Recently started weight training, was told by doctor that she may have pulled a muscle.     HPI   Patient's last menstrual period was 08/04/2017 (approximate).  She reports that her period ended on 08/13/17.  She reports that her periods are every 28 days and lasting 7 days She states that she has been spotting in her underwear the size of a quarter She reports that she did not take a pregnancy test    She reports that she used dial soap and shortly after that she noticed some foul odor from the vagina She states that she has increased vaginal odor She reports that she has a fish odor    History reviewed. No pertinent past medical history.  Current Outpatient Medications  Medication Sig Dispense Refill  . metroNIDAZOLE (METROGEL VAGINAL) 0.75 % vaginal gel Place 1 Applicatorful vaginally at bedtime for 5 days. 70 g 0  . ondansetron (ZOFRAN) 4 MG tablet Take 1 tablet (4 mg total) by mouth every 8 (eight) hours as needed for nausea or vomiting. (Patient not taking: Reported on 08/31/2017) 7 tablet 0   No current facility-administered medications for this visit.     Allergies: No Known Allergies  Past Surgical History:  Procedure Laterality Date  . LEG SURGERY      Social History   Socioeconomic History  . Marital status: Single    Spouse name: Not on file  . Number of children: Not on file  . Years of education: Not on file  . Highest education level: Not on file  Occupational History  . Not on file  Social Needs  . Financial resource strain: Not on file  . Food insecurity:    Worry: Not on file    Inability: Not on file  . Transportation needs:    Medical: Not on file    Non-medical: Not on file  Tobacco Use  . Smoking status: Never Smoker  . Smokeless tobacco: Never Used    Substance and Sexual Activity  . Alcohol use: Yes    Comment: occ  . Drug use: No  . Sexual activity: Yes    Birth control/protection: None  Lifestyle  . Physical activity:    Days per week: Not on file    Minutes per session: Not on file  . Stress: Not on file  Relationships  . Social connections:    Talks on phone: Not on file    Gets together: Not on file    Attends religious service: Not on file    Active member of club or organization: Not on file    Attends meetings of clubs or organizations: Not on file    Relationship status: Not on file  Other Topics Concern  . Not on file  Social History Narrative  . Not on file    History reviewed. No pertinent family history.   ROS Review of Systems See HPI Constitution: No fevers or chills No malaise No diaphoresis Skin: No rash or itching Eyes: no blurry vision, no double vision GU: no dysuria or hematuria Neuro: no dizziness or headaches * all others reviewed and negative   Objective: Vitals:   08/31/17 1641  BP: 120/80  Pulse: 74  Temp: 98.6 F (37 C)  TempSrc: Oral  SpO2: 98%  Weight: 140 lb (63.5 kg)  Height: 5\' 3"  (1.6 m)    Physical Exam Vaginal exam- Chaperone Present Labia normal bilaterally without skin lesions Urethral meatus normal appearing without erythema Vagina with blood from cervical os Uterus midline, nontender  Assessment and Plan Jaslin was seen today for vaginal bleeding.  Diagnoses and all orders for this visit:  Vaginal bleeding -     POCT urine pregnancy -     Cancel: POCT urinalysis dipstick -     POCT Wet + KOH Prep  Abnormal urine odor -     Cancel: POCT Microscopic Urinalysis ([facility]) -     Cancel: POCT urinalysis dipstick  Other orders -     metroNIDAZOLE (METROGEL VAGINAL) 0.75 % vaginal gel; Place 1 Applicatorful vaginally at bedtime for 5 days.  pt started her menses while in the office Discussed that the large amount of bleeding and negative pregnancy test with  the date of her lmp means she was spotting and is now on her period Wet prep showed clue cells Advised to wait until after finishing period and start metrogel   Hardeep Reetz A Creta Levin

## 2017-08-31 NOTE — Patient Instructions (Addendum)
   IF you received an x-ray today, you will receive an invoice from [facility]. Please contact [facility] at [phone] with questions or concerns regarding your invoice.   IF you received labwork today, you will receive an invoice from [facility]. Please contact [facility] at [phone] with questions or concerns regarding your invoice.   Our billing staff will not be able to assist you with questions regarding bills from these companies.  You will be contacted with the lab results as soon as they are available. The fastest way to get your results is to activate your My Chart account. Instructions are located on the last page of this paperwork. If you have not heard from us regarding the results in 2 weeks, please contact this office.     Bacterial Vaginosis Bacterial vaginosis is a vaginal infection that occurs when the normal balance of bacteria in the vagina is disrupted. It results from an overgrowth of certain bacteria. This is the most common vaginal infection among women ages 15-44. Because bacterial vaginosis increases your risk for STIs (sexually transmitted infections), getting treated can help reduce your risk for chlamydia, gonorrhea, herpes, and HIV (human immunodeficiency virus). Treatment is also important for preventing complications in pregnant women, because this condition can cause an early (premature) delivery. What are the causes? This condition is caused by an increase in harmful bacteria that are normally present in small amounts in the vagina. However, the reason that the condition develops is not fully understood. What increases the risk? The following factors may make you more likely to develop this condition:  Having a new sexual partner or multiple sexual partners.  Having unprotected sex.  Douching.  Having an intrauterine device (IUD).  Smoking.  Drug and alcohol abuse.  Taking certain antibiotic medicines.  Being  pregnant.  You cannot get bacterial vaginosis from toilet seats, bedding, swimming pools, or contact with objects around you. What are the signs or symptoms? Symptoms of this condition include:  Grey or white vaginal discharge. The discharge can also be watery or foamy.  A fish-like odor with discharge, especially after sexual intercourse or during menstruation.  Itching in and around the vagina.  Burning or pain with urination.  Some women with bacterial vaginosis have no signs or symptoms. How is this diagnosed? This condition is diagnosed based on:  Your medical history.  A physical exam of the vagina.  Testing a sample of vaginal fluid under a microscope to look for a large amount of bad bacteria or abnormal cells. Your health care provider may use a cotton swab or a small wooden spatula to collect the sample.  How is this treated? This condition is treated with antibiotics. These may be given as a pill, a vaginal cream, or a medicine that is put into the vagina (suppository). If the condition comes back after treatment, a second round of antibiotics may be needed. Follow these instructions at home: Medicines  Take over-the-counter and prescription medicines only as told by your health care provider.  Take or use your antibiotic as told by your health care provider. Do not stop taking or using the antibiotic even if you start to feel better. General instructions  If you have a female sexual partner, tell her that you have a vaginal infection. She should see her health care provider and be treated if she has symptoms. If you have a female sexual partner, he does not need treatment.  During treatment: ? Avoid sexual activity until you finish treatment. ?   Do not douche. ? Avoid alcohol as directed by your health care provider. ? Avoid breastfeeding as directed by your health care provider.  Drink enough water and fluids to keep your urine clear or pale yellow.  Keep the  area around your vagina and rectum clean. ? Wash the area daily with warm water. ? Wipe yourself from front to back after using the toilet.  Keep all follow-up visits as told by your health care provider. This is important. How is this prevented?  Do not douche.  Wash the outside of your vagina with warm water only.  Use protection when having sex. This includes latex condoms and dental dams.  Limit how many sexual partners you have. To help prevent bacterial vaginosis, it is best to have sex with just one partner (monogamous).  Make sure you and your sexual partner are tested for STIs.  Wear cotton or cotton-lined underwear.  Avoid wearing tight pants and pantyhose, especially during summer.  Limit the amount of alcohol that you drink.  Do not use any products that contain nicotine or tobacco, such as cigarettes and e-cigarettes. If you need help quitting, ask your health care provider.  Do not use illegal drugs. Where to find more information:  Centers for Disease Control and Prevention: www.cdc.gov/std  American Sexual Health Association (ASHA): www.ashastd.org  [place] Department of Health and Human Services, Office on Women's Health: www.womenshealth.gov/ or https://www.womenshealth.gov/a-z-topics/bacterial-vaginosis Contact a health care provider if:  Your symptoms do not improve, even after treatment.  You have more discharge or pain when urinating.  You have a fever.  You have pain in your abdomen.  You have pain during sex.  You have vaginal bleeding between periods. Summary  Bacterial vaginosis is a vaginal infection that occurs when the normal balance of bacteria in the vagina is disrupted.  Because bacterial vaginosis increases your risk for STIs (sexually transmitted infections), getting treated can help reduce your risk for chlamydia, gonorrhea, herpes, and HIV (human immunodeficiency virus). Treatment is also important for preventing complications in  pregnant women, because the condition can cause an early (premature) delivery.  This condition is treated with antibiotic medicines. These may be given as a pill, a vaginal cream, or a medicine that is put into the vagina (suppository). This information is not intended to replace advice given to you by your health care provider. Make sure you discuss any questions you have with your health care provider. Document Released: 03/10/2005 Document Revised: 07/14/2016 Document Reviewed: 11/24/2015 Elsevier Interactive Patient Education  2018 [employer]  

## 2017-09-13 ENCOUNTER — Other Ambulatory Visit: Payer: Self-pay

## 2017-09-13 ENCOUNTER — Emergency Department (HOSPITAL_BASED_OUTPATIENT_CLINIC_OR_DEPARTMENT_OTHER): Payer: BC Managed Care – PPO

## 2017-09-13 ENCOUNTER — Emergency Department (HOSPITAL_BASED_OUTPATIENT_CLINIC_OR_DEPARTMENT_OTHER)
Admission: EM | Admit: 2017-09-13 | Discharge: 2017-09-13 | Disposition: A | Discharge status: Home / Self Care | Payer: BC Managed Care – PPO | Attending: Emergency Medicine | Admitting: Emergency Medicine

## 2017-09-13 ENCOUNTER — Encounter (HOSPITAL_BASED_OUTPATIENT_CLINIC_OR_DEPARTMENT_OTHER): Payer: Self-pay | Admitting: Emergency Medicine

## 2017-09-13 DIAGNOSIS — Z041 Encounter for examination and observation following transport accident: Secondary | ICD-10-CM | POA: Diagnosis not present

## 2017-09-13 DIAGNOSIS — R52 Pain, unspecified: Secondary | ICD-10-CM | POA: Diagnosis present

## 2017-09-13 LAB — PREGNANCY, URINE: Preg Test, Ur: NEGATIVE

## 2017-09-13 MED ORDER — ACETAMINOPHEN 500 MG PO TABS
1000.0000 mg | ORAL_TABLET | Freq: Once | ORAL | Status: AC
  Administered 2017-09-13: 1000 mg via ORAL
  Filled 2017-09-13: qty 2

## 2017-09-13 MED ORDER — ACETAMINOPHEN 500 MG PO TABS: 500.0000 mg | ORAL_TABLET | Freq: Four times a day (QID) | ORAL | 0 refills | Status: AC | PRN

## 2017-09-13 MED ORDER — IBUPROFEN 600 MG PO TABS: 600.0000 mg | ORAL_TABLET | Freq: Four times a day (QID) | ORAL | 0 refills | Status: AC | PRN

## 2017-09-13 MED ORDER — METHOCARBAMOL 500 MG PO TABS: 500.0000 mg | ORAL_TABLET | Freq: Two times a day (BID) | ORAL | 0 refills | Status: AC
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
## 2017-09-13 NOTE — Discharge Instructions (Signed)
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
